# Patient Record
Sex: Male | Born: 1960
Health system: Southern US, Community
[De-identification: ages and names within clinical notes are randomized; demographics above are authoritative.]

## PROBLEM LIST (undated history)

## (undated) DIAGNOSIS — E119 Type 2 diabetes mellitus without complications: Secondary | ICD-10-CM

## (undated) DIAGNOSIS — E785 Hyperlipidemia, unspecified: Secondary | ICD-10-CM

## (undated) DIAGNOSIS — G610 Guillain-Barre syndrome: Secondary | ICD-10-CM

## (undated) HISTORY — DX: Type 2 diabetes mellitus without complications: E11.9

## (undated) HISTORY — DX: Hyperlipidemia, unspecified: E78.5

---

## 1998-01-22 ENCOUNTER — Emergency Department (HOSPITAL_COMMUNITY): Admission: EM | Admit: 1998-01-22 | Discharge: 1998-01-22 | Payer: Self-pay | Admitting: Emergency Medicine

## 1998-09-13 ENCOUNTER — Emergency Department (HOSPITAL_COMMUNITY): Admission: EM | Admit: 1998-09-13 | Discharge: 1998-09-13 | Payer: Self-pay | Admitting: Emergency Medicine

## 1998-09-13 ENCOUNTER — Encounter: Payer: Self-pay | Admitting: Emergency Medicine

## 1999-09-10 ENCOUNTER — Emergency Department (HOSPITAL_COMMUNITY): Admission: EM | Admit: 1999-09-10 | Discharge: 1999-09-10 | Payer: Self-pay | Admitting: Emergency Medicine

## 1999-09-10 ENCOUNTER — Encounter: Payer: Self-pay | Admitting: Emergency Medicine

## 2002-01-13 DIAGNOSIS — G610 Guillain-Barre syndrome: Secondary | ICD-10-CM | POA: Insufficient documentation

## 2003-01-30 ENCOUNTER — Emergency Department (HOSPITAL_COMMUNITY): Admission: EM | Admit: 2003-01-30 | Discharge: 2003-01-30 | Payer: Self-pay | Admitting: *Deleted

## 2003-02-03 ENCOUNTER — Emergency Department (HOSPITAL_COMMUNITY): Admission: EM | Admit: 2003-02-03 | Discharge: 2003-02-03 | Payer: Self-pay | Admitting: Emergency Medicine

## 2003-02-04 ENCOUNTER — Encounter: Payer: Self-pay | Admitting: Emergency Medicine

## 2003-02-04 ENCOUNTER — Inpatient Hospital Stay (HOSPITAL_COMMUNITY): Admission: EM | Admit: 2003-02-04 | Discharge: 2003-02-11 | Payer: Self-pay | Admitting: Neurology

## 2003-02-11 ENCOUNTER — Inpatient Hospital Stay (HOSPITAL_COMMUNITY)
Admission: RE | Admit: 2003-02-11 | Discharge: 2003-02-28 | Payer: Self-pay | Admitting: Physical Medicine & Rehabilitation

## 2003-03-03 ENCOUNTER — Encounter
Admission: RE | Admit: 2003-03-03 | Discharge: 2003-06-01 | Payer: Self-pay | Admitting: Physical Medicine & Rehabilitation

## 2003-03-12 ENCOUNTER — Emergency Department (HOSPITAL_COMMUNITY): Admission: EM | Admit: 2003-03-12 | Discharge: 2003-03-12 | Payer: Self-pay | Admitting: Emergency Medicine

## 2003-04-11 ENCOUNTER — Encounter
Admission: RE | Admit: 2003-04-11 | Discharge: 2003-07-10 | Payer: Self-pay | Admitting: Physical Medicine & Rehabilitation

## 2003-06-02 ENCOUNTER — Encounter
Admission: RE | Admit: 2003-06-02 | Discharge: 2003-08-31 | Payer: Self-pay | Admitting: Physical Medicine & Rehabilitation

## 2003-09-01 ENCOUNTER — Encounter
Admission: RE | Admit: 2003-09-01 | Discharge: 2003-11-30 | Payer: Self-pay | Admitting: Physical Medicine & Rehabilitation

## 2004-01-16 ENCOUNTER — Observation Stay (HOSPITAL_COMMUNITY): Admission: EM | Admit: 2004-01-16 | Discharge: 2004-01-17 | Payer: Self-pay | Admitting: *Deleted

## 2004-01-18 ENCOUNTER — Encounter
Admission: RE | Admit: 2004-01-18 | Discharge: 2004-04-17 | Payer: Self-pay | Admitting: Physical Medicine & Rehabilitation

## 2004-04-18 ENCOUNTER — Encounter
Admission: RE | Admit: 2004-04-18 | Discharge: 2004-07-17 | Payer: Self-pay | Admitting: Physical Medicine & Rehabilitation

## 2004-07-18 ENCOUNTER — Encounter
Admission: RE | Admit: 2004-07-18 | Discharge: 2004-10-16 | Payer: Self-pay | Admitting: Physical Medicine & Rehabilitation

## 2004-11-05 ENCOUNTER — Encounter
Admission: RE | Admit: 2004-11-05 | Discharge: 2005-02-03 | Payer: Self-pay | Admitting: Physical Medicine & Rehabilitation

## 2007-12-29 ENCOUNTER — Encounter
Admission: RE | Admit: 2007-12-29 | Discharge: 2008-03-28 | Payer: Self-pay | Admitting: Physical Medicine & Rehabilitation

## 2007-12-31 ENCOUNTER — Ambulatory Visit: Payer: Self-pay | Admitting: Physical Medicine & Rehabilitation

## 2008-02-07 ENCOUNTER — Ambulatory Visit: Payer: Self-pay | Admitting: Physical Medicine & Rehabilitation

## 2008-03-15 ENCOUNTER — Encounter
Admission: RE | Admit: 2008-03-15 | Discharge: 2008-03-27 | Payer: Self-pay | Admitting: Physical Medicine and Rehabilitation

## 2008-03-27 ENCOUNTER — Ambulatory Visit: Payer: Self-pay | Admitting: Physical Medicine and Rehabilitation

## 2010-05-28 NOTE — Assessment & Plan Note (Signed)
Anthony Lane returns to the clinic today since not having been seen since  June 2005.  He has a history of Guillain-Barre syndrome dating to  February 04, 2003.  He has received approximately 3 total courses of IV  immunoglobulin during his hospitalization and than post-hospitalization.  He did not have to undergo any plasmapheresis.  He had significant  weakness of his bilateral upper extremities and still has weakness and  contractures of his hands bilaterally.  He has minimal functional use of  his hands at the present time.   His main reason for return at this time is cramping of his calves along  with cramping of his forearms and hands.  He reports that there is no  significant pain at rest except on an occasional basis.  He also  complaints of his legs cramping and waking him at night.  He does  complain of twitching of his forearms and his hands bilaterally.   The patient has not followed at this point by Dr. Pearlean Brownie.  He was  released a few years ago.   The patient has not applied for disability, but is considering at the  present time.   MEDICATIONS:  None.   REVIEW OF SYSTEMS:  Noncontributory.   PHYSICAL EXAMINATION:  GENERAL:  Reasonably well-appearing, middle-aged  adult male, who ambulates without any assistive device.  EXTREMITIES:  He has 4+/5 strength throughout the bilateral upper  extremities in shoulders, biceps, triceps, and wrist extension.  Grip  strength was 4-/5 bilaterally.  He has very poor coordinate movements of  his fingers and hands bilaterally.  He complaints of tingling of his  bilateral forearms and hands with light touch.  There is mild swelling  of his fingers bilaterally.   IMPRESSION:  Persistent distal deficits of his hands related to Guillain-  Barre syndrome.   At the present time, we have had a long discussion regarding his present  symptoms.  They have question whether cutting the tendons would be  appropriate so that he could extend his  fingers.  The problem is really  not musculoskeletal, but is more in the neurological recovery.  He has  not gained the motor strength related to lack of nerve supply from his  Guillain-Barre.  Cutting the tendons would only make his hands  completely non-useful for him.  Unfortunately, I doubt that there is  anything neurological that we can do in terms of improving his  myelination on his nerves.  He has been out from his injury for  approximately 4 plus years and had maximum response to immunoglobulin  done x3 in the past, once on an outpatient basis.   At this point, we decided to try tizanidine 4 mg to be used 1 tablet  daily.  We still may need to add pain medicines or possibly muscle  relaxing medication in the future, but we will see how he responds to  the tizanidine.  We will see him in follow up at approximately 4-6 weeks  time.           ______________________________  Anthony Lane, M.D.     DC/MedQ  D:  12/31/2007 14:48:54  T:  01/01/2008 06:59:38  Job #:  [id number]

## 2010-05-28 NOTE — Assessment & Plan Note (Signed)
Anthony Lane is a 50 year old married gentleman who has been previously  followed by Dr[facility] Ellwood [facility].   He was last seen by Dr. Thomasena Edis on February 07, 2008.   Anthony Lane has a history of Guillain-Barre  syndrome which was diagnosed  back in 2005 which occurred after GI illness.  He was hospitalized at  that time and was followed by Dr. Pearlean Brownie.   He was left subsequently with weakness and spasticity in both hands  right worse than left as well as some mild weakness and cramping in his  bilateral lower extremity.  Pain is not a major problem for him.  His  average pain is about 1 on the scale of 10.   He had undergone some physical therapy early on in his illness.  However, he has not had any therapy for several years now.   His chief complaint is spasticity and decreased strength and fine motor  coordination of the right upper extremity.  He has similar problems in  the left upper extremity, but not as severe.   He is able to walk at least 15-20 minutes without difficulty.  If he  tries to run or walks much longer, he does get some cramping in his  calf.   He has been using tizanidine and has found that to be somewhat  beneficial in helping manage some of his cramping and spasms over the  last few months.  He has been on it about 2 months now.   He does not have a primary care physician at this time.   Functional status is as follows, he is able to walk 15-20 minutes.  He  can climb stairs.  He does drive.  He is independent with self-care and  sometime needs assistance with fasteners which require fine motor  attention.   He is independent with feeding, dressing, bathing, toileting, meal prep,  household duties.  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  He does not have a lot of endurance for  these activities and breaks up his task quite a bit.  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  He has 1 year of college in the  past.   Denies problems controlling bowel or bladder.  Denies depression anxiety  or suicidal ideation.  Does admit to some tremors and spasms especially  in the hands and distal lower extremities.   Review of systems otherwise negative.   Past medical history is noncontributory.  He has had no previous  surgeries.   SOCIAL HISTORY:  The patient is married and lives with 65, 35, and 51-  year-old.  He has an college age son as well who is not in the house.  Denies illegal substance abuse.  Denies smoking and alcohol.   FAMILY HISTORY:  Positive mother was killed in a car accident when she  was 61 year old, father alive age 10, history of COPD, smoker.  He has 2  brothers, one died the details are not clear, and he has a 7 year old  brother who is healthy.   MEDICATIONS:  He is currently on include just tizanidine 4 mg one p.o.  b.i.d.   No known drug allergies.  He does have some food allergies such as  popcorn and dry blueberries.   PHYSICAL EXAMINATION:  Blood pressure is 118/87, pulse 90, respirations  18, 97% saturated on  room air.  He is well-developed, well-nourished  gentleman who does not appear in any distress.  He is oriented x3.  Speech is clear.  Affect is bright.  He is alert, cooperative, and  pleasant.  Follows commands without difficulty.  Answers questions  appropriately.   Cranial nerves are grossly intact.  Coordination is diminished in both  hands.  Reflexes are overall 2+ at the upper extremities symmetric, 2+  at the patellar tendons, and 1+ at the Achilles tendons bilaterally.   Increased tone is noted in the right upper extremity especially in the  wrist flexors and finger flexors, and also notable in the left upper  extremity, however, not as severe as in the right upper extremity.   Weakness is noted in bilateral finger flexors and intrinsics, finger  extensors, wrist extensors, and wrist flexors,  5/5 at biceps, triceps,  brachioradialis, and shoulder abductors.  Internal and external rotation  are all within normal strength range.  He has 5/5 strength at knee  flexors, extensors, hip extensors, and flexors are 5/5.  Dorsiflexors  and plantar flexors are 5/5.  Weakness and EHL as noted bilaterally,  however, evertors are 5/5.   Gait is nonantalgic.  He has good balance.  Tandem gait.  Romberg test  are performed adequately.  He has tightness in bilateral pectoralis  muscles, right worse than left.  Internal and external rotation are  tight as well.   Diminished sensation to pinprick, temperature in upper and lower  extremities, intact vibratory sense is appreciated, however.   IMPRESSION:  History of Guillain-Barre  syndrome with residual deficits  in predominately upper extremities with strength and spasticity, lower  extremity bilaterally mild with deficits noted with respect to endurance  and spasms.   Pain is not a major problem for him at this time.   PLAN:  We will continue tizanidine.  He has couple of refills left from  Dr. Thomasena Edis.  We will obtain some blood work to check creatinine as well  as liver functions.  I reviewed some exercises for him today to work on  stretching as well as some strengthening.  He may consider more of a  formal physical therapy program in the upcoming months.  We discussed  possibility of Botox as well, but we will start some therapy prior to  that.  We will see him back in 2 months.           ______________________________  Brantley Stage, M.D.     DMK/MedQ  D:  03/27/2008 11:40:13  T:  03/28/2008 01:52:17  Job #:  [id number]

## 2010-05-28 NOTE — Assessment & Plan Note (Signed)
Anthony Lane returns to clinic today for followup evaluation.  We last saw  him in this office on December 31, 2007, for evaluation of spasticity  and tingling related to Guillain-Barre syndrome.  We had started him on  tizanidine 4 mg daily at that time.  He reports that he has had some  improvement in the spasms of his left lower leg and his right forearm.  He does not experience any significant side effects related to the  medication.   MEDICATIONS:  Tizanidine 4 mg daily.   REVIEW OF SYSTEMS:  Noncontributory.   PHYSICAL EXAMINATION:  Well-appearing, middle-aged adult male, ambulates  without any assist device.  Blood pressure is 123/80 with pulse of 76,  respiratory rate 18, and O2 saturation 97% on room air.  Grip was 4-/5  bilaterally with poor coordinated movements of his fingers and hands  bilaterally.  There is mild swelling of the fingers bilaterally.   IMPRESSION:  Persistent distal deficits of the hands related to Guillain-  Barre syndrome.   In the office today, we did suggest that he increase his tizanidine up  to 4 mg b.i.d., and we will monitor him for side effects or benefit.  We  will plan on seeing the patient in followup in this office in  approximately 2 months' time.           ______________________________  Ellwood Dense, M.D.     DC/MedQ  D:  02/09/2008 09:46:32  T:  02/09/2008 23:46:16  Job #:  [id number]

## 2010-05-31 NOTE — Discharge Summary (Signed)
NAME:  Anthony Lane, Anthony Lane                        ACCOUNT NO.:  [id number]   MEDICAL RECORD NO.:  [id number]                   PATIENT TYPE:  IPS   LOCATION:  4005                                 FACILITY:  [facility]   PHYSICIAN:  Ellwood Dense, M.D.                DATE OF BIRTH:  1960-07-26   DATE OF ADMISSION:  02/11/2003  DATE OF DISCHARGE:  02/28/2003                                 DISCHARGE SUMMARY   DISCHARGE DIAGNOSES:  1. Guillain-Barre syndrome.  2. Abnormal liver function tests.  3. Neuropathy, improved.   HISTORY OF PRESENT ILLNESS:  Anthony Lane is a 50 year old male in relative  good health admitted to [facility] January 22 with recent GI viral  illness with weight loss and four-day history of progressive bilateral lower  extremity weakness and trouble ambulating and handling things.  Dr. Pearlean Brownie  was consulted for workup.  MRI of brain done showed no acute abnormality and  nonspecific hyperintensity.  MRI of spine showed small paracentral HNP at 4-  5.  No cord compression.  Spinal tap showed elevated protein.  Other workup  included elevated ESR with ANA, RPR nonreactive, HIV nonreactive.  TSH,  vitamin, Monospot test were all negative.  Neurology feels patient's  ascending paralysis related to GBS even though reflexes were preserved.  The  patient was treated with IVIG x 5 doses.  PT and OT initiated and patient  minimum assistance 75% transfers, ambulating 250 feet with close  supervision, increasing fatigue, total assistance for ADLs.   PAST MEDICAL HISTORY:  Negative for major illnesses or surgery.   ALLERGIES:  No known drug allergies.   SOCIAL HISTORY:  The patient is married, works as a Medical illustrator, and was  independent prior to admission.  Lives in two-level home with three to four  steps at entry, can stay on first level.  He does not use any tobacco or  alcohol.   HOSPITAL COURSE:  Anthony Lane was admitted to rehab on February 11, 2003, for  inpatient therapy to consist of PT, OT daily.  Past admission, he  was maintained on subcutaneous Lovenox for DVT prophylaxis.  He was noted to  have __________ right lower extremity and ____________.  Labs done past  admission showed hemoglobin 13.9, hematocrit 29.3, white count 4.5,  platelets 322.  Sodium 137, potassium 4.3, chloride 104, CO2 25, BUN 27,  creatinine 1.2, glucose 98.  LFTs continued to be slightly elevated with AST  53, ALT 78, total bilirubin 1.3.  The patient has had no GI symptoms.   At time of admission, the patient's strength passive range of motion right  upper extremity was  within normal limits.  Shoulder strength on right was  4/5.  Shoulder abductors at 3/5.  Elbow flexion extension 2/5.  Supination  3/5, pronation 3/5, wrist flexors 1/5, wrist extensors 2/5, intrinsics for  flexion 1 to 3 range and 0/5 for  extension.  Passive range of motion left  upper extremity  within functional limits.  Strength of shoulder and elbow  flexors and extensors 3/5.  He was at 2/5 for supination, 3/5 for pronation.  Wrist was grossly estimated at 1/5 flexors, 0/5 extension, digits 1 to 4  range for flexors.  In terms of lower extremities, he was noted to have  plantar dorsiflexion knee flexion extension at 2 to 4/5 with hip flexors  4+/5.  Therapies initiated.  Lab equipment was ordered to help patient with  self feed.  Bilaterally boots were also ordered to help with gait.  The  patient did progress to being modified independent for transfers, modified  independent for ambulating 350 feet with rolling walker.  He required  supervision for stairs.  In terms of ADLs, he is at supervision for bathing  with assistive device, modified independent for upper body dressing, minimum  assistance for lower body dressing.  He is modified independent for grooming  and feeding.  He is at supervision for tub-shower transfers, modified  independent for toilet transfer, minimum assistance for  hygiene, minimum  assistance for manipulation.   At time of discharge, the patient's upper extremity strength remains the  same.  Range of motion of shoulder on right and left is improved to 160 plus  shoulder flexion abductors at 170 degrees.  Both flexion and extension  remains the same.  Supination, pronation bilaterally is approximately 20  degrees.  Wrist flexors with gravity eliminated is approximately 10 degrees  bilaterally.  He is noted to have minimal finger movement.  Dorsal wrist  supports with cuff have been used for self feeding and writing.  The patient  will continue to receive followup PT and OT at [facility] beginning March 03, 2003.   On February 28, 2003, the patient is discharged to home.   DISCHARGE MEDICATIONS:  1. Ibuprofen 800mg  t.i.d.  2. Colace 100 mg 2 p.o. q.a.m.  3. Ambien 5 mg q.h.s. p.r.n.  4. Coated aspirin 325 mg daily x 1 month.   ACTIVITY:  Use walker.   DIET:  Regular.   WOUND CARE:  Not applicable.   SPECIAL INSTRUCTIONS:  May use Tylenol additionally for pain.  May use  Dulcolax tablets p.r.n. constipation.  No alcohol, no smoking, no driving.  Outpatient PT, OT at [facility] beginning March 03, 2003, at 11:15 a.m. to 1:30 p.m.   FOLLOW UP:  The patient is to follow up with Dr. Pearlean Brownie for appointment in  three to four weeks.  Follow up with Dr. Thomasena Edis on March 30 at 12:30 p.m.      Greg Cutter, P.A.                    Ellwood Dense, M.D.    PP/MEDQ  D:  03/16/2003  T:  03/17/2003  Job:  [id number]   cc:   Pramod P. Pearlean Brownie, MD  Fax: [phone]

## 2010-05-31 NOTE — Assessment & Plan Note (Signed)
FOLLOW UP:  Anthony Lane returns to the clinic today for follow-up  evaluation.  He is a 50 year old adult male who is admitted to [facility] February 04, 2003 with a recent viral illness and a weight  loss along with a four-day history of progressive bilateral lower extremity  weakness.  He was admitted and a MRI scan showed no acute abnormality with  nonspecific hyperintensity.  MRI scan showed small paracentral herniated  nucleus pulposus at L4/5 without cord compression.  Spinal tap showed  elevated protein.  Workup revealed elevated ESR and ANA.  The patient was  felt to be suffering from Guinn-Barre syndrome and was started on IVIG x 5  doses over five days.   The patient eventually stabilized and was moved to the rehabilitation unit  on February 11, 2003.  He remained there through discharge February 28, 2003.   Since discharge, the patient has been attending outpatient physical therapy  and occupational therapy twice per week.  He has followed up with his  doctor, Dr. Janalyn Shy P. Pearlean Brownie, this past Monday, April 10, 2003.  Dr. Janalyn Shy  P. Pearlean Brownie is planning restart of immunoglobulin over five days either at home  or on an outpatient basis at the hospital.  They are in the process of  trying to set that up and the patient will be informed of their plans once  that has been set up through their insurance carrier.  They also discussed  possible plasmapheresis but decided to go with immunoglobulin at the present  time.   The patient is not driving on his own at the present time.  He does dress  himself 90% of the time and feeds himself after meals are prepared.  He does  have trouble especially with buttons, buckles, along with zippers, and  dawning his dogs.  He is otherwise independent with all mobility.  He has  taken the ankle port orthosis off his left lower extremity and feels that  has actually helped.  He has slight improved strength on the right lower  extremity  compared to the left.  He still has severe weakness of the distal  bilateral upper extremities.  He has noticed left hip pain once the ankle  port orthosis was stopped.  He is ambulating without a device at the present  time.   MEDICATIONS:  1. Ibuprofen 800 mg t.i.d. p.r.n.  2. Colace p.r.n.  3. Ambien p.r.n.  4. Coated aspirin q.d.   PHYSICAL EXAMINATION:  GENERAL:  A well-appearing adult male.  VITAL SIGNS:  Blood pressure 118/82 with a pulse of 98.  O2 saturation of  98% on room air.  EXTREMITIES:  Proximal bilateral upper extremity showed biceps of 4/5, an  elbow extension of 3+/5.  Grip was 2+ to 3-/5 bilaterally with minimal  individual finger movements.  Bilateral lower extremity exam showed hip  flexion at 5-/5 and the extension at 4+/5.  Ankle dorsiflexion was 3-/5  bilaterally.  Sensation was decreased to light touch below elbows and below  knees bilaterally.   IMPRESSION:  Persistent distal deficits related to Guinn-Barre syndrome.   RECOMMENDATIONS:  At the present time, Dr. Janalyn Shy P. Pearlean Brownie is in the  processing of arranging either home health immunoglobulin or outpatient IVIG  over five days.  At this point, the patient will continue on outpatient  therapies as his schedule allows depending on the immunoglobulin  administration.  We will plan on seeing him in follow-up in this clinic in  approximately two months time.  He continues to make slow progress and  hopefully another course of immunoglobulin will him make substantially more  improvement.   The patient continues to be unemployable at the present time and reports  that he is allowed long-term disability for approximately six months by his  employer.  It has been a little over two months at this point.   I will plan on seeing the patient in follow-up in approximately two months  time.      Ellwood Dense, M.D.   DC/MedQ  D:  04/12/2003 16:01:01  T:  04/12/2003 17:11:02  Job #:  [id number]

## 2010-05-31 NOTE — Assessment & Plan Note (Signed)
FOLLOW UP:  Mr. Abreu returns to the clinic today for follow-up evaluation.  He has experienced onset of Guillain-Barre syndrome starting approximately  February 04, 2003.  The patient received a five-day course of immunoglobulin  while hospitalized.  He subsequently was removed to the rehabilitation unit  on February 11, 2003.  He remained there through discharge February 28, 2003.   Since discharge, the patient has followed up in this office.  He has also  undergone a second course of immunoglobulin approximately eight weeks ago.  He is due for nerve conduction studies along with EMG this coming Friday.  Dr. Janalyn Shy P. Pearlean Brownie is still considering plasmapheresis based on those  studies.   The patient is not driving and does need some help with socks at the present  time.  Otherwise, he is independent with most normal activities of daily  living.  He has not returned to work and is in the process of trying to  reach his employer to see if possibly they could have him back at modified  duty.  He is very fearful about losing his job as that also supplies him,  four children, and wife with health insurance.  The patient still  understands that he may have to apply for SSI to maintain health insurance  for himself and his family.   MEDICATIONS:  None.   REVIEW OF SYMPTOMS:  The patient reports minimal sporadic pain of his  bilateral hands, forearms, along with left foot.  He reports that these are  not severe enough to require any pain medicines at the present time.   PHYSICAL EXAMINATION:  GENERAL:  Well-appearing adult male with obvious  weakness of his distal upper extremities.  VITAL SIGNS:  Blood pressure was 141/103 with pulse of 84, and O2 saturation  of 96% on room air.  NEUROLOGICAL:  Bilateral upper extremity exam showed 5/5 strength of the  shoulder and elbow extension was 4+/5 on the right and 4/5 on the left.  Elbow flexion was 3+/5 bilaterally.  Grip was 0 to 1/5 bilaterally  and wrist  extension 0 to 1/5.  He has obvious swollen fingers bilaterally with  decreased range of motion especially of the fingers and wrist bilaterally.  Bilateral lower extremity exam showed hip flexion, knee extension, and ankle  dorsiflexion at 5-/5 bilaterally.   IMPRESSION:  Persistent distal deficits related to Guillain-Barre syndrome.   RECOMMENDATIONS:  At the present time, Dr. Janalyn Shy P. Pearlean Brownie is considering  plasmapheresis for Ms. Riding.  We will plan on seeing the patient in follow-  up in his office in approximately two months time.  He can continue as  outpatient physical therapy and occupational therapy and is making good  progress overall, although he still has very severe dysfunction of his  bilateral upper extremities, especially distally.  I will plan on seeing the  patient in follow-up as noted above.      Ellwood Dense, M.D.   DC/MedQ  D:  06/14/2003 15:29:24  T:  06/14/2003 19:38:53  Job #:  [id number]

## 2010-05-31 NOTE — H&P (Signed)
NAME:  Anthony Lane, Anthony Lane                        ACCOUNT NO.:  [id number]   MEDICAL RECORD NO.:  [id number]                   PATIENT TYPE:  INP   LOCATION:  ED                                   FACILITY:  [facility]   PHYSICIAN:  Pramod P. Pearlean Brownie, MD                 DATE OF BIRTH:  Nov 11, 1960   DATE OF ADMISSION:  02/04/2003  DATE OF DISCHARGE:                                HISTORY & PHYSICAL   REFERRING PHYSICIAN:  Dr. Devoria Albe.   REASON FOR REFERRAL:  Hand and leg weakness.   HISTORY OF PRESENT ILLNESS:  The patient is a 50 year old, elderly,  Caucasian male, who has been complaining of progressive bilateral hand and  leg weakness for the last 4 days.  The patient states that he had apparent  GI viral illness last week.  He had diarrhea, nausea, and vomiting last  Friday through the weekend.  He was seen in the emergency room and was  treated for dehydration and sent home.  The patient states he continued to  have a poor appetite and has lost 15 pounds weight.  Since Wednesday, he  complained of difficulty walking and weakness in his hands.  He was seen in  the emergency room yesterday at [facility] and apparently told that  he had some weakness as a result of his viral illness.  Neurology was not  consulted.  The patient returns today saying that his weakness has  progressed.  He has trouble walking and seems to be off balance, even while  walking on level ground.  He has also had difficulty holding objects in his  hands.  He denies any headache, nausea, vomiting, slurred speech, neck pain,  radicular symptoms, tingling, numbness, burning, or pain in his extremities.  There is no urinary hesitancy or retention, any abnormal sweating or  dizziness.  He has no significant past neurological history.   PAST MEDICAL HISTORY:  Unremarkable.   PAST SURGICAL HISTORY:  None.   MEDICATION ALLERGIES:  None.   CURRENT HOME MEDICATIONS:  None.   REVIEW OF SYSTEMS:  Significant for  nausea, vomiting, and diarrhea for 3  days last week, as stated earlier.  No chest pain, shortness of breath,  cough, cold.   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  He does not smoke or drink or do drugs.  He is  married and lives with his wife in [place].   PHYSICAL EXAMINATION:  GENERAL:  A healthy-looking, young male, who is not  in distress.  VITAL SIGNS:  He is afebrile with pulse rate of 72 per minute and regular,  respiratory rate 16 per minute __________.  HEAD:  Nontraumatic.  NECK:  Supple without bruit.  ENT:  Unremarkable.  CARDIAC:  No murmur or gallop.  LUNGS:  Clear to auscultation.  The patient has no shortness of  breath.  He  has a good breath-holding time of greater than 60 seconds and a single  breath count greater than 65.  NEUROLOGIC:  Awake, alert, and oriented x 3 with normal speech and language  function.  There is no aphasia, apraxia, or dysarthria.  Pupils are equal,  reactive to light.  He has horizontal gaze, evoke nystagmus while looking in  either direction, right more than left, and there is no weakness of the  facial muscles eye closure, polyp.  He has good strength in his neck  extensors as well as flexors.  UPPER EXTREMITY:  No drift.  He has 5/5 strength at the shoulders  bilaterally.  He has 4 minus strength at both elbows, slightly _________  left greater than right.  He has grade 3 strength at both wrists and has a  poor grip and significant weakness of long finger flexors and __________  hand muscles of both hands.  He has 5/5 strength in both hips and knees  bilaterally.  He has bilateral foot drop with 0-5 strength, and that  includes dorsiflexors and everters, and he has 4/5 strength in both plantar  flexors.  His jaw jerk, biceps, triceps, and supinator jerks are easily  inducible.  Knee jerks are brisk.  Ankle jerks are depressed but can be  elicited.  Plantars are both  downgoing.  He has intact touch, pinprick  position and vibration sensation.  Finger-to-nose coordination is slightly  impaired bilaterally.  Knee to heel coordination is slow but accurate.  The  patient's gait was not tested.   DATA REVIEWED:   LABS:  Blood chemistries and CBC are unremarkable.  Head CAT scan,  noncontrast, shows no significant abnormalities.   IMPRESSION:  A 50 year old gentleman with 4 day history of progressive  distal lower and upper extremity weakness with preserved reflexes following  viral gastrointestinal illness.  The time frame and history is suggestive of  postinfectious polyneuropathy like Guillain-Barre syndrome; however, the  preserved reflexes would be unusual though still possible given the fact  that his symptoms are of only 4 days of duration.  Other possibility  includes postviral acute disseminated encephalomyelitis (ADEM).   PLAN:  The patient is being admitted to the neurology service for further  evaluation and treatment.  We will obtain MRI scan of the brain with and  without contrast as well as to evaluate for any demyelinating brain lesions  and MRI scan of the C-spine to rule out any evidence of cord compression.  If both these are negative, may consider doing a spinal tap to look for  elevated CSF, proteins.  The patient may benefit with treatment with IVIG  which will consider after the above test results come back.  The patient  will need close neurological and respiratory monitoring to look for signs of  progression.  At the present time, his respiratory status seems quite stable  and hence will admit him to a regular floor.  If he starts showing signs of  difficulty breathing, we will need to transfer him to the intensive care  unit for further close monitoring.  I had a long discussion with the  patient, his wife, and multiple family members about his symptoms and discussed by clinical impression, differential diagnosis, and plan for   evaluation and treatment and answered questions.  Pramod P. Pearlean Brownie, MD    PPS/MEDQ  D:  02/04/2003  T:  02/04/2003  Job:  [id number]

## 2010-05-31 NOTE — Discharge Summary (Signed)
NAME:  Anthony Lane, Anthony Lane                        ACCOUNT NO.:  [id number]   MEDICAL RECORD NO.:  [id number]                   PATIENT TYPE:  INP   LOCATION:  3003                                 FACILITY:  [facility]   PHYSICIAN:  Pramod P. Pearlean Brownie, MD                 DATE OF BIRTH:  [birth year]   DATE OF ADMISSION:  02/04/2003  DATE OF DISCHARGE:  02/11/2003                                 DISCHARGE SUMMARY   ADMISSION DIAGNOSES:  Bilateral hand and leg weakness.   DISCHARGE DIAGNOSES:  Acute posterior infectious polyneuropathy, likely  Guillain-Barre's syndrome.   HOSPITAL COURSE:  The patient is a pleasant 51 year old Caucasian male who  developed bilateral distal hand and foot weakness four days to admission.  The patient had a preceding apparent GI viral illness one week ago, with  diarrhea, nausea and vomiting.  He denied any paresthesias or neck pain.  When evaluated in the [facility] Emergency Room he had significant  distal weakness, with foot drop and severe hand and wrist weakness.  Deep  tendon reflexes were preserved, except ankle jerks were depressed.  The  patient was admitted for evaluation and treatment for possible posterior  infectious polyneuropathy.  He was transferred to [facility], where he was hospitalized to 3000.  He was kept on telemetry  monitoring, which did not reveal significant cardiac arrhythmias during the  course of hospitalization.  He did not have significant weakness of the  facial or neck muscles.  He had good breath-holding time and forced vital  capacity remained between 2-4 L; NIF remained greater 80-120 during the  hospitalization.   He progressed on the day after hospitalization and there was some increasing  weakness of both shoulders, biceps and triceps.  There was minimal weakness  of the hip flexors.  His deep tendon reflexes are relatively preserved,  except biceps and supine intratrochanteric's, which would  transiently  depressed for a few days.   He had MRI scan of the brain performed on February 04, 2003; did not reveal  any evidence of demyelinating lesions in the brain.  MRI of the C-spine also  revealed no evidence of compression or demagnifications.  A spinal tap was  performed which showed minimally elevated protein of 53 mg%, without  significantly increased status.   The patient was given a test dose of IV IG 5 mg on February 05, 2003.  He  tolerated it without any reaction.  Subsequently he was given IV Ig 0.4  g/km/day for five consecutive days.  He tolerated this with only minimal  discomfort and pain, which responded to medication with Tylenol and Vicodin.   On the day of discharge he had finished his five days of IV IG, and actually  had started noticing some improvement.  He complained of some paresthesia  infarcts, and was in fact able to bend his toes and fingers a little  bit;  his shoulder also seemed stronger though still weak.  He had significant  weakness of both hand and grip, as well as wrist muscles and bilateral foot  drop with 0/5 strength in ankle dorsiflexors and 2/5 strength in the plantar  flexors.  Deep tendon reflexes could easily be detected.  The biceps jerk  was a little depressed.   His forced vital capacity was 4.5 L and NIF was greater than -120.  He was  discharged to Rehabilitation and was advised to follow-up with Dr. Pearlean Brownie in  his office in two months.   DISCHARGE MEDICATIONS:  1. Lovenox injection b.i.d.  2. Tylenol Extra-Strength p.r.n. pain.  3. Colace 100 mg q.d.  4. Ibuprofen 800 mg t.i.d. p.r.n. pain.  5. Vicodin 1-2 tablets q.4h. p.r.n. pain.   LABORATORY DATA:  ANA was negative.  VSR was normal.  __________ were  normal.  Hepatitis panel negative.  HIV negative.  CMV antibodies:  IgG  elevated, though IgM was normal.  Urine positive for porphobilinogen was  negative.                                                Pramod P. Pearlean Brownie,  MD    PPS/MEDQ  D:  02/11/2003  T:  02/11/2003  Job:  [id number]

## 2012-04-25 ENCOUNTER — Encounter (HOSPITAL_COMMUNITY): Payer: Self-pay | Admitting: *Deleted

## 2012-04-25 ENCOUNTER — Emergency Department (HOSPITAL_COMMUNITY): Payer: Medicare Other

## 2012-04-25 ENCOUNTER — Emergency Department (HOSPITAL_COMMUNITY)
Admission: EM | Admit: 2012-04-25 | Discharge: 2012-04-25 | Disposition: A | Discharge status: Home / Self Care | Payer: Medicare Other | Attending: Emergency Medicine | Admitting: Emergency Medicine

## 2012-04-25 DIAGNOSIS — R209 Unspecified disturbances of skin sensation: Secondary | ICD-10-CM | POA: Insufficient documentation

## 2012-04-25 DIAGNOSIS — Z8669 Personal history of other diseases of the nervous system and sense organs: Secondary | ICD-10-CM | POA: Insufficient documentation

## 2012-04-25 DIAGNOSIS — M25519 Pain in unspecified shoulder: Secondary | ICD-10-CM | POA: Insufficient documentation

## 2012-04-25 DIAGNOSIS — M25512 Pain in left shoulder: Secondary | ICD-10-CM

## 2012-04-25 DIAGNOSIS — Z7982 Long term (current) use of aspirin: Secondary | ICD-10-CM | POA: Insufficient documentation

## 2012-04-25 HISTORY — DX: Guillain-Barre syndrome: G61.0

## 2012-04-25 LAB — COMPREHENSIVE METABOLIC PANEL
ALT: 66 U/L — ABNORMAL HIGH (ref 0–53)
AST: 36 U/L (ref 0–37)
Albumin: 4.2 g/dL (ref 3.5–5.2)
Alkaline Phosphatase: 58 U/L (ref 39–117)
BUN: 14 mg/dL (ref 6–23)
CO2: 28 mEq/L (ref 19–32)
Calcium: 10 mg/dL (ref 8.4–10.5)
Chloride: 100 mEq/L (ref 96–112)
Creatinine, Ser: 1.1 mg/dL (ref 0.50–1.35)
GFR calc Af Amer: 87 mL/min — ABNORMAL LOW (ref >90)
GFR calc non Af Amer: 75 mL/min — ABNORMAL LOW (ref >90)
Glucose, Bld: 136 mg/dL — ABNORMAL HIGH (ref 70–99)
Potassium: 3.8 mEq/L (ref 3.5–5.1)
Sodium: 137 mEq/L (ref 135–145)
Total Bilirubin: 0.5 mg/dL (ref 0.3–1.2)
Total Protein: 7.6 g/dL (ref 6.0–8.3)

## 2012-04-25 LAB — CBC
HCT: 42.5 % (ref 39.0–52.0)
Hemoglobin: 15.7 g/dL (ref 13.0–17.0)
MCH: 32.8 pg (ref 26.0–34.0)
MCHC: 36.9 g/dL — ABNORMAL HIGH (ref 30.0–36.0)
MCV: 88.9 fL (ref 78.0–100.0)
Platelets: 235 10*3/uL (ref 150–400)
RBC: 4.78 MIL/uL (ref 4.22–5.81)
RDW: 12.2 % (ref 11.5–15.5)
WBC: 8.6 10*3/uL (ref 4.0–10.5)

## 2012-04-25 MED ORDER — HYDROCODONE-ACETAMINOPHEN 5-325 MG PO TABS: 1.0000 | ORAL_TABLET | Freq: Four times a day (QID) | ORAL | Status: DC | PRN

## 2012-04-25 NOTE — ED Notes (Signed)
Pt also states he took 3 81 mg ASA pta

## 2012-04-25 NOTE — ED Provider Notes (Addendum)
History     CSN: [id number]  Arrival date & time 04/25/12  Anthony Lane   First MD Initiated Contact with Patient 04/25/12 2058      Chief Complaint  Patient presents with  . Arm Pain    (Consider location/radiation/quality/duration/timing/severity/associated sxs/prior treatment) HPI Comments: Anthony Lane is a 52 year old gentleman, with complicated history of Guillian-Barre  Reye's syndrome.  Several years ago, with residual paresthesias.  He reports, that he's had intermittent, left shoulder pain for quite a while, but today woke up with persistent pain.  That has progressed to numbness in his arm radiating to his neck, and the side of his face.  Denies any chest pain, shortness of breath, diaphoresis, or nausea.  He does, state that the pain increases with certain movements.  Patient is a 52 y.o. male presenting with arm pain. The history is provided by the patient.  Arm Pain This is a recurrent problem. The current episode started today. The problem occurs constantly. The problem has been gradually worsening. Associated symptoms include numbness. Pertinent negatives include no fever or joint swelling.    Past Medical History  Diagnosis Date  . Guillain-Barre syndrome     History reviewed. No pertinent past surgical history.  History reviewed. No pertinent family history.  History  Substance Use Topics  . Smoking status: Never Smoker   . Smokeless tobacco: Not on file  . Alcohol Use: No      Review of Systems  Constitutional: Negative for fever.  Musculoskeletal: Negative for joint swelling.  Neurological: Positive for numbness.  All other systems reviewed and are negative.    Allergies  Amoxicillin  Home Medications   Current Outpatient Rx  Name  Route  Sig  Dispense  Refill  . aspirin 81 MG chewable tablet   Oral   Chew 243 mg by mouth once.         Marland Kitchen HYDROcodone-acetaminophen (NORCO/VICODIN) 5-325 MG per tablet   Oral   Take 1 tablet by mouth every 6  (six) hours as needed for pain.   10 tablet   0     BP 145/89  Pulse 71  Temp(Src) 98.1 F (36.7 C) (Oral)  Resp 20  SpO2 95%  Physical Exam  Nursing note and vitals reviewed. Constitutional: He appears well-developed.  HENT:  Head: Normocephalic.  Eyes: Pupils are equal, round, and reactive to light.  Neck: Normal range of motion.  Cardiovascular: Normal rate and regular rhythm.   Pulmonary/Chest: Effort normal and breath sounds normal.  Musculoskeletal: He exhibits tenderness. He exhibits no edema.       Left shoulder: He exhibits decreased range of motion, tenderness and pain. He exhibits no bony tenderness, no swelling, no effusion, no crepitus, no deformity, no laceration and normal pulse.  Patient with contracture deformities of both hands as result of [place]    ED Course  Procedures (including critical care time)  Labs Reviewed  CBC - Abnormal; Notable for the following:    MCHC 36.9 (*)    All other components within normal limits  COMPREHENSIVE METABOLIC PANEL - Abnormal; Notable for the following:    Glucose, Bld 136 (*)    ALT 66 (*)    GFR calc non Af Amer 75 (*)    GFR calc Af Amer 87 (*)    All other components within normal limits  POCT I-STAT TROPONIN I   Dg Shoulder Left  04/25/2012  *RADIOLOGY REPORT*  Clinical Data: Left shoulder pain.  LEFT SHOULDER - 2+ VIEW  Comparison: None.  Findings: There is no fracture, dislocation, or soft tissue calcification.  Old healed fracture of the left clavicle.  IMPRESSION: No acute abnormality.   Original Report Authenticated By: Francene Boyers, M.D.      1. Shoulder pain, left     ED ECG REPORT   Date: 04/25/2012  EKG Time: 10:44 PM  Rate: 83  Rhythm: sinus arrhythmia, incomplete right bundle branch block,  there are no previous tracings available for comparison  Axis: normal  Intervals:none  ST&T Change: normal  Narrative Interpretation: abnormal             MDM   Patient's troponin is  negative.  His EKG reveals a sinus rhythm, with sinus arrhythmia, and incomplete right bundle branch block.  There are no old EKGs for comparison.  Labs reviewed as a negative troponin Will x-ray shoulder, and refer patient to orthopedics X-ray of the left shoulder is noncontributory to the patient's pain.  He has been referred to Dr. supple.  For orthopedic surgery for further evaluation and physical therapy        Arman Filter, NP 04/25/12 2245  Arman Filter, NP 04/27/12 [phone]

## 2012-04-25 NOTE — ED Notes (Signed)
Pt c/o left arm numbness and tingling that began this morning when he woke up.  States it feels like "pins and needles" at this time. Denies n/v/d, SOB, CP.

## 2012-04-27 NOTE — ED Provider Notes (Signed)
Medical screening examination/treatment/procedure(s) were performed by non-physician practitioner and as supervising physician I was immediately available for consultation/collaboration.  Raeford Razor, MD 04/27/12 1115

## 2012-04-28 NOTE — ED Provider Notes (Signed)
Medical screening examination/treatment/procedure(s) were performed by non-physician practitioner and as supervising physician I was immediately available for consultation/collaboration.  Raeford Razor, MD 04/28/12 1537

## 2013-04-15 DIAGNOSIS — A938 Other specified arthropod-borne viral fevers: Secondary | ICD-10-CM | POA: Diagnosis not present

## 2014-02-14 IMAGING — CR DG SHOULDER 2+V*L*
3 series · 3 of 3 positions shown · non-contrast
Comparison: None.

CLINICAL DATA: Left shoulder pain.

LEFT SHOULDER - 2+ VIEW

[w shoulder ap internal left]
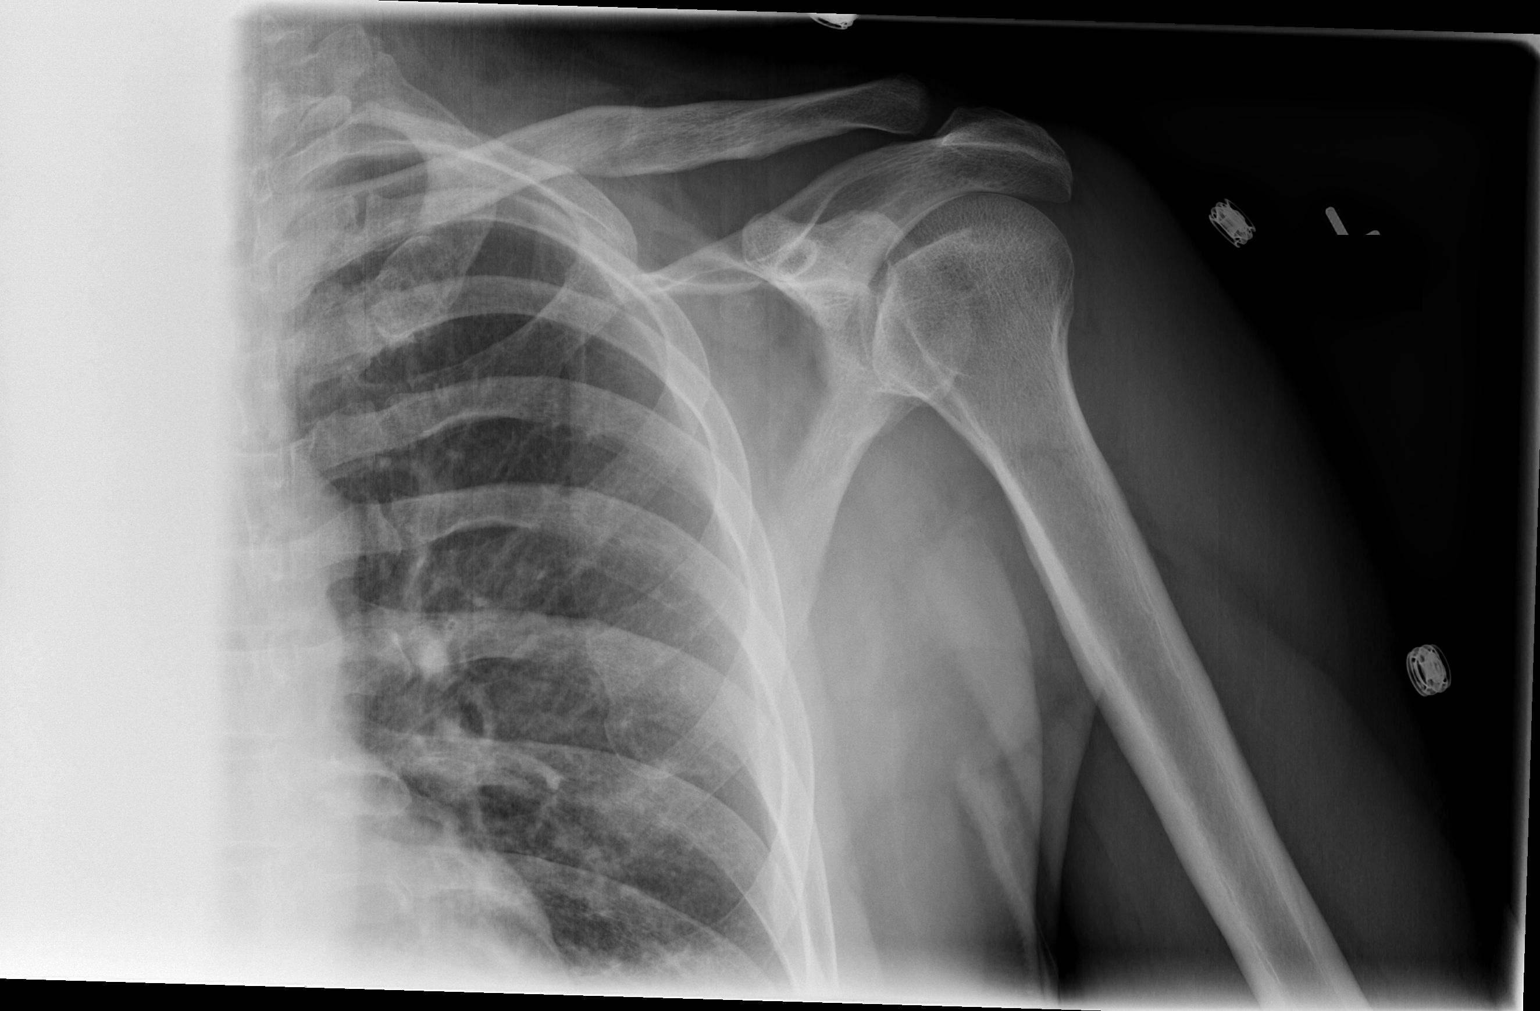

[w shoulder ap external left]
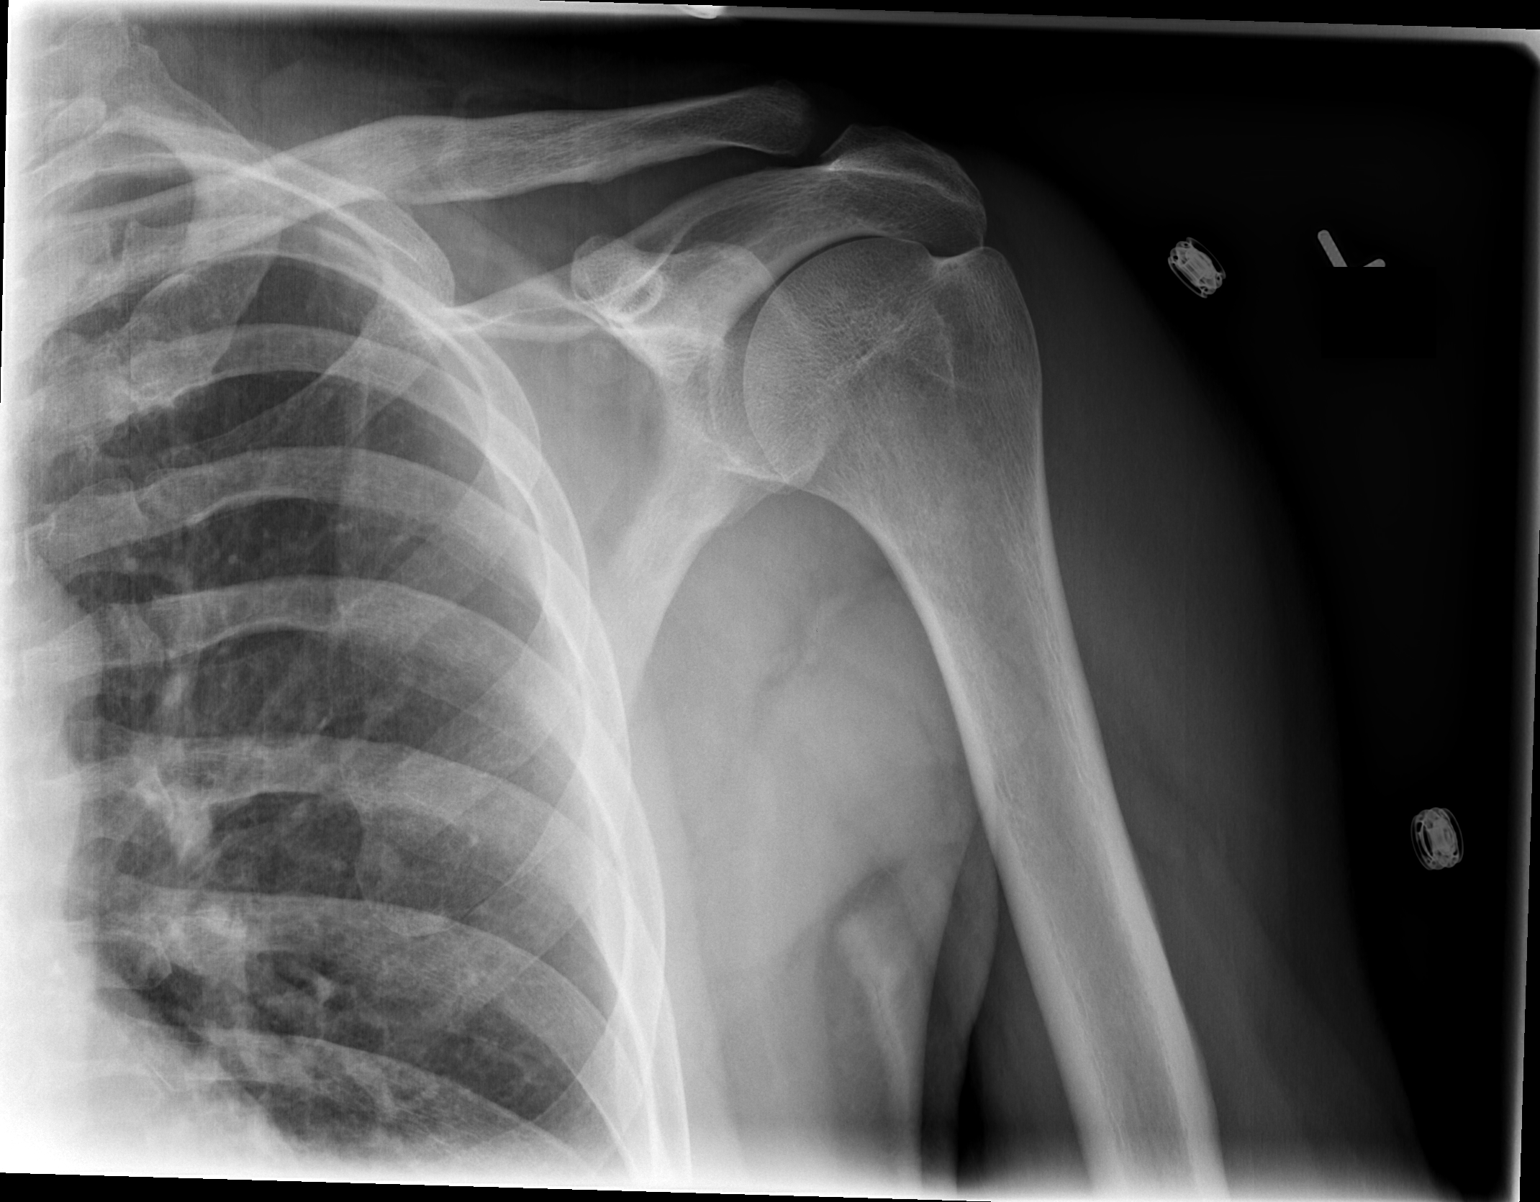

[w shoulder y view left]
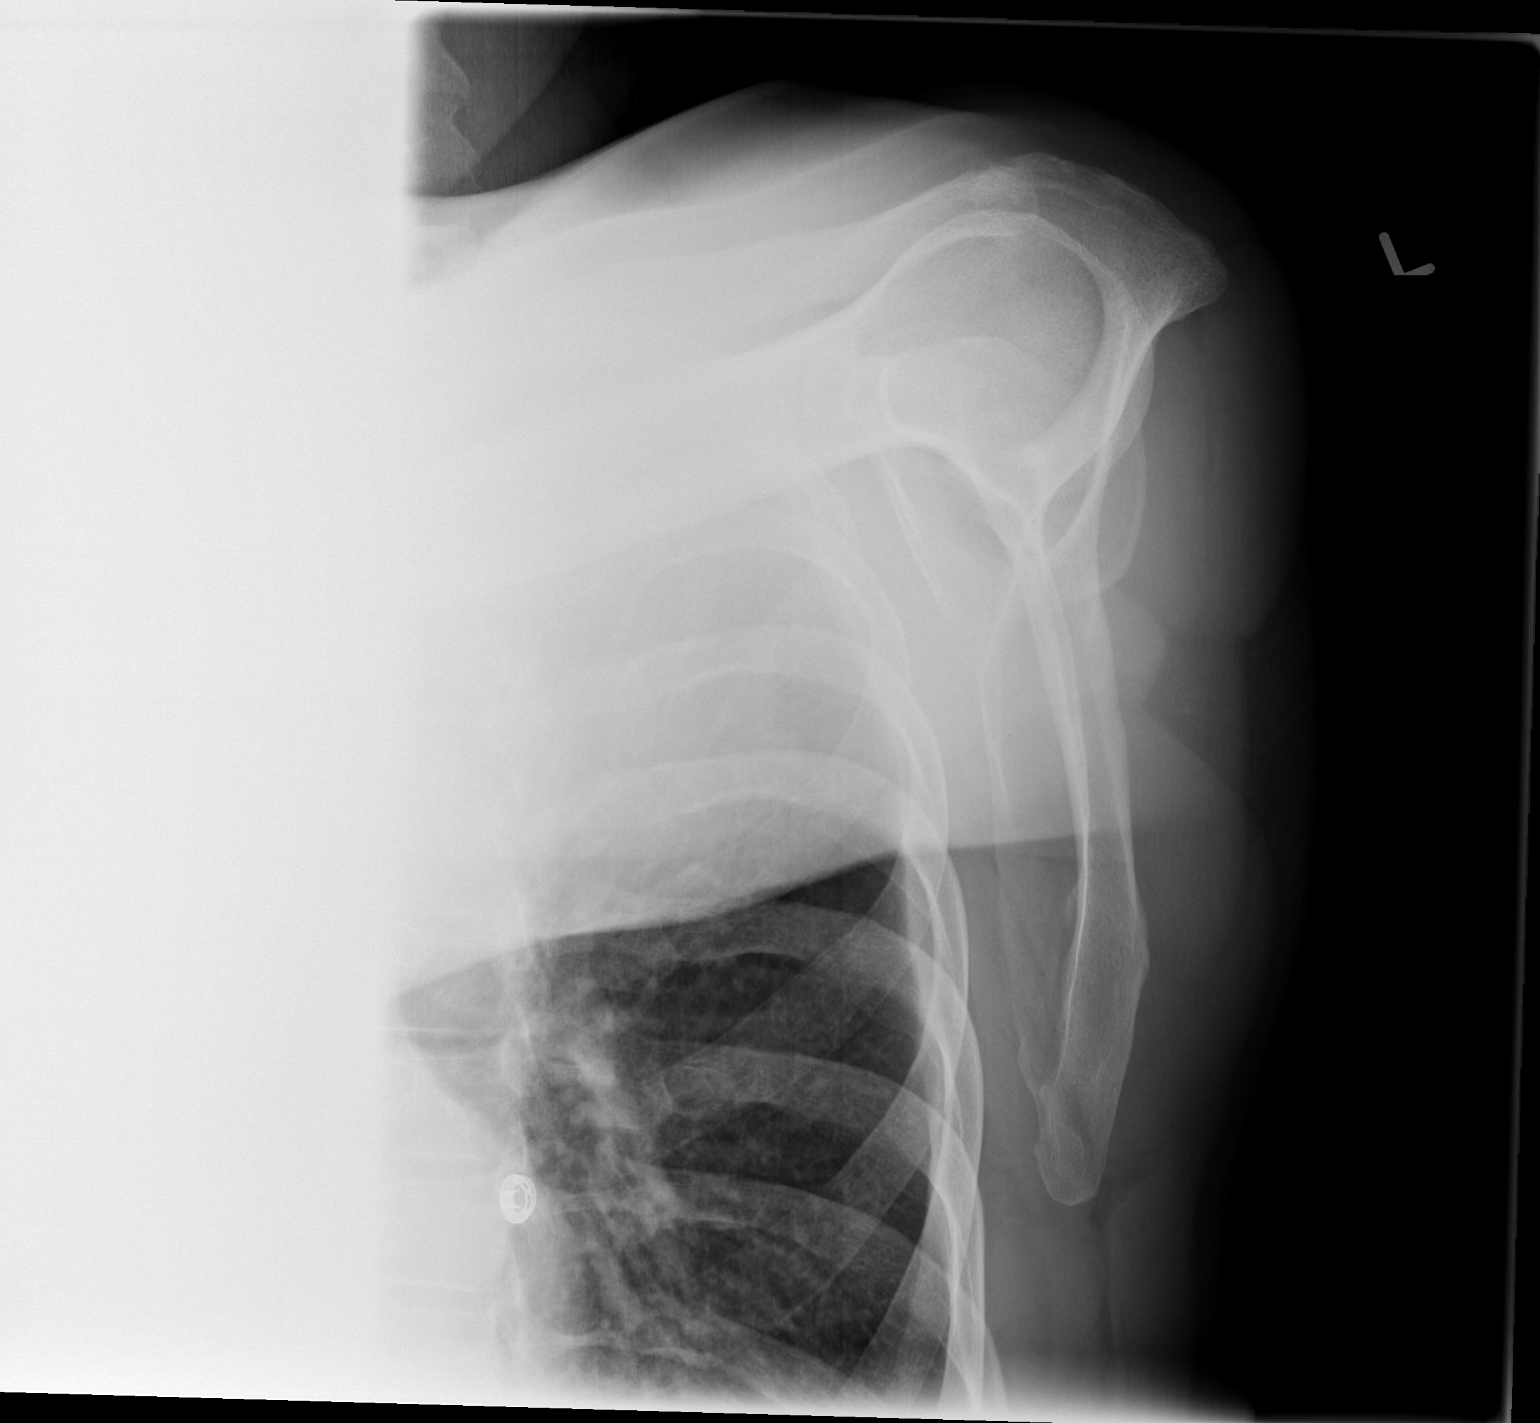

[3 of 3 positions shown; findings below may reference images not displayed]

FINDINGS: There is no fracture, dislocation, or soft tissue
calcification.  Old healed fracture of the left clavicle.
IMPRESSION: No acute abnormality.

## 2014-11-16 DIAGNOSIS — J0100 Acute maxillary sinusitis, unspecified: Secondary | ICD-10-CM | POA: Diagnosis not present

## 2014-11-16 DIAGNOSIS — J029 Acute pharyngitis, unspecified: Secondary | ICD-10-CM | POA: Diagnosis not present

## 2015-11-28 DIAGNOSIS — J309 Allergic rhinitis, unspecified: Secondary | ICD-10-CM | POA: Diagnosis not present

## 2016-06-23 ENCOUNTER — Emergency Department (HOSPITAL_COMMUNITY)
Admission: EM | Admit: 2016-06-23 | Discharge: 2016-06-24 | Disposition: A | Discharge status: Home / Self Care | Payer: Medicare Other | Attending: Emergency Medicine | Admitting: Emergency Medicine

## 2016-06-23 ENCOUNTER — Encounter (HOSPITAL_COMMUNITY): Payer: Self-pay | Admitting: Emergency Medicine

## 2016-06-23 DIAGNOSIS — Z79899 Other long term (current) drug therapy: Secondary | ICD-10-CM | POA: Insufficient documentation

## 2016-06-23 DIAGNOSIS — R0989 Other specified symptoms and signs involving the circulatory and respiratory systems: Secondary | ICD-10-CM | POA: Insufficient documentation

## 2016-06-23 MED ORDER — GLUCAGON HCL RDNA (DIAGNOSTIC) 1 MG IJ SOLR
1.0000 mg | Freq: Once | INTRAMUSCULAR | Status: AC
  Administered 2016-06-23: 1 mg via INTRAVENOUS
  Filled 2016-06-23: qty 1

## 2016-06-23 MED ORDER — LORAZEPAM 2 MG/ML IJ SOLN
0.5000 mg | Freq: Once | INTRAMUSCULAR | Status: AC
  Administered 2016-06-23: 0.5 mg via INTRAVENOUS
  Filled 2016-06-23: qty 1

## 2016-06-23 NOTE — ED Provider Notes (Signed)
[facility] DEPT Provider Note   CSN: [id number] Arrival date & time: 06/23/16  2052     History   Chief Complaint Chief Complaint  Patient presents with  . Foreign Body    HPI Anthony Lane is a 56 y.o. male.  HPI 56 year old Caucasian male with no significant past medical history presents to the emergency Department today with complaints of foreign body sensation in his throat. Patient states that approximately 1930 this evening he was eating barbecue and swallow and feels like it got lodged in his throat. Patient states that this has happened before and it usually resolves after he drinks coke. He tried Coca-Cola tonight and was unsuccessful. Patient states that he is able swallow a small amount of his sputum but has to spit up. States though atypical, and he had a symmetrical cold bathtub. He denies any difficulties breathing. Denies any shortness of breath. Denies any fever, chills, throat swelling sensation, hematemesis. Past Medical History:  Diagnosis Date  . Guillain-Barre syndrome ([place])     There are no active problems to display for this patient.   History reviewed. No pertinent surgical history.     Home Medications    Prior to Admission medications   Medication Sig Start Date End Date Taking? Authorizing Provider  HYDROcodone-acetaminophen (NORCO/VICODIN) 5-325 MG per tablet Take 1 tablet by mouth every 6 (six) hours as needed for pain. Patient not taking: Reported on 06/23/2016 04/25/12   Junius Creamer, NP    Family History History reviewed. No pertinent family history.  Social History Social History  Substance Use Topics  . Smoking status: Never Smoker  . Smokeless tobacco: Not on file  . Alcohol use No     Allergies   Patient has no known allergies.   Review of Systems Review of Systems  Constitutional: Negative for chills and fever.  HENT: Positive for trouble swallowing. Negative for congestion.   Respiratory: Negative for shortness of  breath.   Gastrointestinal: Negative for abdominal pain, diarrhea, nausea and vomiting.     Physical Exam Updated Vital Signs BP (!) 147/92 (BP Location: Left Arm)   Pulse 82   Temp 98.7 F (37.1 C) (Oral)   Resp 20   Ht 5\' 9"  (1.753 m)   Wt 91.6 kg (202 lb)   SpO2 99%   BMI 29.83 kg/m   Physical Exam  Constitutional: He appears well-developed and well-nourished. No distress.  HENT:  Head: Normocephalic and atraumatic.  Oropharynx clear. Patient is able swallow sputum but does spit a small amount backup. Speaking complete sentences and maintaining his airway.  Eyes: Right eye exhibits no discharge. Left eye exhibits no discharge. No scleral icterus.  Neck: Normal range of motion. Neck supple.  Pulmonary/Chest: Effort normal. No respiratory distress. He has no wheezes. He has no rales. He exhibits no tenderness.  Speaking in complete sentences and maintaining his airway.  Musculoskeletal: Normal range of motion.  Neurological: He is alert.  Skin: Skin is warm and dry. Capillary refill takes less than 2 seconds. No pallor.  Nursing note and vitals reviewed.    ED Treatments / Results  Labs (all labs ordered are listed, but only abnormal results are displayed) Labs Reviewed - No data to display  EKG  EKG Interpretation None       Radiology No results found.  Procedures Procedures (including critical care time)  Medications Ordered in ED Medications  glucagon (human recombinant) (GLUCAGEN) injection 1 mg (1 mg Intravenous Given 06/23/16 2306)  LORazepam (ATIVAN) injection  0.5 mg (0.5 mg Intravenous Given 06/23/16 2306)     Initial Impression / Assessment and Plan / ED Course  I have reviewed the triage vital signs and the nursing notes.  Pertinent labs & imaging results that were available during my care of the patient were reviewed by me and considered in my medical decision making (see chart for details).     Patient was asked to the emergency room today  with complaints of foreign body sensation in his throat. Patient reports trouble swallowing but is managing secretions at this time. We'll try glucagon, Ativan, soda. We'll reevaluate after this and determine need for further intervention by GI.  Meds were given. Patient was able to swallow Coke and food bolus was past. He feels much improved and ready for discharge. Vital signs remain stable. He is speaking complete sentences and maintaining his airway. Given GI follow up.   Pt is hemodynamically stable, in NAD, & able to ambulate in the ED. Pain has been managed & has no complaints prior to dc. Pt is comfortable with above plan and is stable for discharge at this time. All questions were answered prior to disposition. Strict return precautions for f/u to the ED were discussed.   Final Clinical Impressions(s) / ED Diagnoses   Final diagnoses:  Foreign body sensation in throat    New Prescriptions New Prescriptions   No medications on file     Aaron Edelman 06/23/16 2348    Veryl Speak, MD 06/26/16 [phone]

## 2016-06-23 NOTE — ED Triage Notes (Signed)
Pt reports eating at appx 1930 and feels like a piece is lodged in throat. Pt reports symptoms occurring before and was able to be resolved with coca-cola but tonight was unsuccessful. Pt currently alert and oriented x4. Pt reports having trouble swallowing at this time.

## 2016-06-23 NOTE — ED Notes (Signed)
Pt given a coke 

## 2016-06-23 NOTE — Discharge Instructions (Signed)
Return to the ED if he developed any worsening symptoms. Follow up with the GI doctor if symptoms persist.

## 2016-11-25 DIAGNOSIS — B355 Tinea imbricata: Secondary | ICD-10-CM | POA: Diagnosis not present

## 2017-02-02 DIAGNOSIS — R509 Fever, unspecified: Secondary | ICD-10-CM | POA: Diagnosis not present

## 2017-02-02 DIAGNOSIS — M791 Myalgia, unspecified site: Secondary | ICD-10-CM | POA: Diagnosis not present

## 2017-02-02 DIAGNOSIS — J039 Acute tonsillitis, unspecified: Secondary | ICD-10-CM | POA: Diagnosis not present

## 2018-01-14 DIAGNOSIS — J111 Influenza due to unidentified influenza virus with other respiratory manifestations: Secondary | ICD-10-CM | POA: Diagnosis not present

## 2018-03-30 DIAGNOSIS — R21 Rash and other nonspecific skin eruption: Secondary | ICD-10-CM | POA: Diagnosis not present

## 2019-04-13 DIAGNOSIS — Z7189 Other specified counseling: Secondary | ICD-10-CM | POA: Diagnosis not present

## 2019-04-13 DIAGNOSIS — Z20828 Contact with and (suspected) exposure to other viral communicable diseases: Secondary | ICD-10-CM | POA: Diagnosis not present

## 2019-09-01 DIAGNOSIS — C4441 Basal cell carcinoma of skin of scalp and neck: Secondary | ICD-10-CM | POA: Diagnosis not present

## 2019-09-01 DIAGNOSIS — D485 Neoplasm of uncertain behavior of skin: Secondary | ICD-10-CM | POA: Diagnosis not present

## 2019-09-01 DIAGNOSIS — L570 Actinic keratosis: Secondary | ICD-10-CM | POA: Diagnosis not present

## 2019-09-01 DIAGNOSIS — L821 Other seborrheic keratosis: Secondary | ICD-10-CM | POA: Diagnosis not present

## 2019-09-01 DIAGNOSIS — L819 Disorder of pigmentation, unspecified: Secondary | ICD-10-CM | POA: Diagnosis not present

## 2019-09-15 DIAGNOSIS — C4441 Basal cell carcinoma of skin of scalp and neck: Secondary | ICD-10-CM | POA: Diagnosis not present

## 2019-10-08 ENCOUNTER — Telehealth (HOSPITAL_COMMUNITY): Payer: Self-pay | Admitting: Nurse Practitioner

## 2019-10-08 NOTE — Telephone Encounter (Signed)
Called to discuss with Prentiss Bells Hingle about Covid symptoms and the use of casirivimab/imdevimab, a combination monoclonal antibody infusion for those with mild to moderate Covid symptoms and at a high risk of hospitalization.     Pt is qualified for this infusion at the [facility] infusion center due to co-morbid conditions (BMI and Guillain Barre). He is interested in infusion but is waiting to hear from his Neurologist before proceeding. Will call back if he decides to receive.   Alda Lea, AGPCNP-BC

## 2019-10-10 DIAGNOSIS — R112 Nausea with vomiting, unspecified: Secondary | ICD-10-CM | POA: Diagnosis not present

## 2019-10-10 DIAGNOSIS — R1111 Vomiting without nausea: Secondary | ICD-10-CM | POA: Diagnosis not present

## 2019-10-10 DIAGNOSIS — U071 COVID-19: Secondary | ICD-10-CM | POA: Diagnosis not present

## 2019-10-10 DIAGNOSIS — R000 Tachycardia, unspecified: Secondary | ICD-10-CM | POA: Diagnosis not present

## 2019-10-10 DIAGNOSIS — E860 Dehydration: Secondary | ICD-10-CM | POA: Diagnosis not present

## 2019-10-12 DIAGNOSIS — Z20828 Contact with and (suspected) exposure to other viral communicable diseases: Secondary | ICD-10-CM | POA: Diagnosis not present

## 2019-10-12 DIAGNOSIS — K591 Functional diarrhea: Secondary | ICD-10-CM | POA: Diagnosis not present

## 2019-10-14 DIAGNOSIS — U071 COVID-19: Secondary | ICD-10-CM | POA: Diagnosis not present

## 2019-11-23 DIAGNOSIS — M24549 Contracture, unspecified hand: Secondary | ICD-10-CM | POA: Diagnosis not present

## 2019-11-23 DIAGNOSIS — D6869 Other thrombophilia: Secondary | ICD-10-CM | POA: Diagnosis not present

## 2019-11-23 DIAGNOSIS — E663 Overweight: Secondary | ICD-10-CM | POA: Diagnosis not present

## 2019-11-23 DIAGNOSIS — Z8669 Personal history of other diseases of the nervous system and sense organs: Secondary | ICD-10-CM | POA: Diagnosis not present

## 2019-11-23 DIAGNOSIS — Z8616 Personal history of COVID-19: Secondary | ICD-10-CM | POA: Diagnosis not present

## 2020-02-20 DIAGNOSIS — E785 Hyperlipidemia, unspecified: Secondary | ICD-10-CM | POA: Diagnosis not present

## 2020-02-20 DIAGNOSIS — Z125 Encounter for screening for malignant neoplasm of prostate: Secondary | ICD-10-CM | POA: Diagnosis not present

## 2020-02-27 DIAGNOSIS — E785 Hyperlipidemia, unspecified: Secondary | ICD-10-CM | POA: Diagnosis not present

## 2020-02-27 DIAGNOSIS — R7989 Other specified abnormal findings of blood chemistry: Secondary | ICD-10-CM | POA: Diagnosis not present

## 2020-02-27 DIAGNOSIS — Z7189 Other specified counseling: Secondary | ICD-10-CM | POA: Diagnosis not present

## 2020-02-27 DIAGNOSIS — Z8669 Personal history of other diseases of the nervous system and sense organs: Secondary | ICD-10-CM | POA: Diagnosis not present

## 2020-02-27 DIAGNOSIS — M24549 Contracture, unspecified hand: Secondary | ICD-10-CM | POA: Diagnosis not present

## 2020-02-27 DIAGNOSIS — E663 Overweight: Secondary | ICD-10-CM | POA: Diagnosis not present

## 2020-02-27 DIAGNOSIS — Z1331 Encounter for screening for depression: Secondary | ICD-10-CM | POA: Diagnosis not present

## 2020-02-27 DIAGNOSIS — Z0000 Encounter for general adult medical examination without abnormal findings: Secondary | ICD-10-CM | POA: Diagnosis not present

## 2020-06-24 DIAGNOSIS — H10212 Acute toxic conjunctivitis, left eye: Secondary | ICD-10-CM | POA: Diagnosis not present

## 2020-08-16 DIAGNOSIS — U071 COVID-19: Secondary | ICD-10-CM | POA: Diagnosis not present

## 2020-08-22 ENCOUNTER — Ambulatory Visit: Payer: Medicare Other | Admitting: Neurology

## 2020-09-03 ENCOUNTER — Ambulatory Visit (INDEPENDENT_AMBULATORY_CARE_PROVIDER_SITE_OTHER): Payer: Medicare Other | Admitting: Neurology

## 2020-09-03 ENCOUNTER — Encounter: Payer: Self-pay | Admitting: Neurology

## 2020-09-03 ENCOUNTER — Other Ambulatory Visit: Payer: Self-pay

## 2020-09-03 VITALS — BP 122/78 | HR 79 | Ht 69.0 in | Wt 189.0 lb

## 2020-09-03 DIAGNOSIS — Z8669 Personal history of other diseases of the nervous system and sense organs: Secondary | ICD-10-CM | POA: Diagnosis not present

## 2020-09-03 DIAGNOSIS — U071 COVID-19: Secondary | ICD-10-CM | POA: Diagnosis not present

## 2020-09-03 NOTE — Progress Notes (Signed)
[facility] [address] [place]. [place] [address] [phone]       OFFICE CONSULT NOTE  [name] Date of Birth:  [birth year] Medical Record Number:  [id number]   Referring MD: Imagene Sheller  Reason for Referral: History of GBS and risk of COVID vaccination  HPI: Anthony Lane is a pleasant 60 year old Caucasian male seen today for initial office consultation visit.  He is unaccompanied today.  History is obtained from him and no prior imaging studies or significant medical records available for review.  Patient has no significant past medical history except remote history of Guillain-Barr syndrome in 2005.  He was actually seen by me at that time and initially admitted to [facility] and then transferred to [facility].  He had a preceding GI illness 1 week prior to admission..  He had quadriparesis but did not require any ventilatory support.  He was hospitalized for nearly 30 days and had prolonged recovery with rehabilitation.  MRI scan of the brain at that time showed no acute abnormality and only nonspecific hyperintensity.  MRI of the C-spine showed small paracentral herniated disc at C4-5 but no cord compression.  Spinal tap showed elevated CSF proteins but otherwise unremarkable.  ESR was mildly elevated ANA, RPR, HIV nonreactive.  He was treated with IVIG 5 doses and transferred to inpatient rehab.  He still has some residual weakness in his hand muscles with flexion contractures but can ambulate independently without any assistance.  He has not had any flareup or worsening of his GBS over the years.  Patient has been reluctant in getting flu vaccines every year and now with recent COVID he has questions about relationship of COVID vaccination to possible exacerbation of his GBS.  In fact had COVID infection in November 2021 which predominantly upper respite tract symptoms but these were mild and he was not hospitalized and has recovered fully.  He  denies any increase in weakness, tingling, numbness, worsening gait balance or fatigue.  He in fact has no active neurological complaints at this time.  Patient had a history of brief hospitalization in January  ROS:   14 system review of systems is positive for tingling, numbness, weakness all other systems negative  PMH:  Past Medical History:  Diagnosis Date   Guillain-Barre syndrome ([place])     Social History:  Social History   Socioeconomic History   Marital status: Married    Spouse name: [name]   Number of children: Not on file   Years of education: Not on file   Highest education level: Not on file  Occupational History   Not on file  Tobacco Use   Smoking status: Never   Smokeless tobacco: Never  Substance and Sexual Activity   Alcohol use: No   Drug use: No   Sexual activity: Not on file  Other Topics Concern   Not on file  Social History Narrative   Lives with wife and 3 daughters   Right Handed   Drinks soda occassionally   Social Determinants of Health   Financial Resource Strain: Not on file  Food Insecurity: Not on file  Transportation Needs: Not on file  Physical Activity: Not on file  Stress: Not on file  Social Connections: Not on file  Intimate Partner Violence: Not on file    Medications:   Current Outpatient Medications on File Prior to Visit  Medication Sig Dispense Refill   Multiple Vitamins-Minerals (MULTIVITAMIN WITH MINERALS) tablet Take 1 tablet by  mouth daily.     No current facility-administered medications on file prior to visit.    Allergies:  No Known Allergies  Physical Exam General: well developed, well nourished pleasant middle-age Caucasian male, seated, in no evident distress Head: head normocephalic and atraumatic.   Neck: supple with no carotid or supraclavicular bruits Cardiovascular: regular rate and rhythm, no murmurs Musculoskeletal: no deformity Skin:  no rash/petichiae Vascular:  Normal pulses all  extremities  Neurologic Exam Mental Status: Awake and fully alert. Oriented to place and time. Recent and remote memory intact. Attention span, concentration and fund of knowledge appropriate. Mood and affect appropriate.  Cranial Nerves: Fundoscopic exam reveals sharp disc margins. Pupils equal, briskly reactive to light. Extraocular movements full without nystagmus. Visual fields full to confrontation. Hearing intact. Facial sensation intact. Face, tongue, palate moves normally and symmetrically.  Motor: Normal bulk and tone. Normal strength in all tested extremity muscles.  Wasting of intrinsic hand muscles bilaterally with fixed flexion contractures of the fingers.  Weakness of intrinsic hand muscles right greater than left hand but good strength at the wrist, elbow and shoulders bilaterally mild weakness of ankle dorsiflexors bilaterally with foot drop.  Mild intermittent fine tremor of upper extremity left greater than right. Sensory.: intact to touch , pinprick , position and vibratory sensation.  Coordination: Rapid alternating movements normal in all extremities. Finger-to-nose and heel-to-shin performed accurately bilaterally. Gait and Station: Arises from chair without difficulty. Stance is normal. Gait demonstrates normal stride length and balance but bilateral mild foot drop.  Mild difficulty with walking on his heels but can walk on his toes  reflexes: 1+ and symmetric.  Except both supinator and right bicep jerk is depressed.  Toes downgoing.       ASSESSMENT: 60 year old Caucasian male with remote history of Guillain-Barr syndrome in 2005 with mild residual hand weakness and recent history of COVID infection in November 2021 with concerns about risk of COVID and other vaccinations affecting his GBS.  No active new neurological complaints.     PLAN: I had a long discussion with the patient regarding his remote history of Guillain-Barr syndrome and recent COVID infection and the  risk of neuromuscular worsening following risk of COVID or flu flu vaccinations and answered questions.  I reviewed CDC guidelines about GBS and vaccines and relationship of GBS after COVID-vaccine and answered questions.  This is being carefully monitored and so for the clinical guidelines still recommend COVID vaccination for patients with remote history of GBS.  Since she has had recent COVID infection he needs to wait at least 6 months for his natural immunity antibodies to decline before considering COVID vaccination.  I gave him literature from the [facility] website to review.  No further neurological testing or follow-up is necessary at the present time.  Greater than 50% time during this 45-minute consultation visit was spent on counseling and coordination of care about his remote history of GBS as well as recent COVID and the risk of worsening of his symptoms following COVID vaccination or other vaccination and answering questions. Antony Contras, MD  Note: This document was prepared with digital dictation and possible smart phrase technology. Any transcriptional errors that result from this process are unintentional.

## 2020-09-03 NOTE — Patient Instructions (Signed)
I had a long discussion with the patient regarding his remote history of Guillain-Barr syndrome and recent COVID infection and the risk of neuromuscular worsening following risk of COVID or flu flu vaccinations and answered questions.  I reviewed CDC guidelines about GBS and vaccines and relationship of GBS after COVID-vaccine and answered questions.  This is being carefully monitored and so for the clinical guidelines still recommend COVID vaccination for patients with remote history of GBS.  Since she has had recent COVID infection he needs to wait at least 6 months for his natural immunity antibodies to decline before considering COVID vaccination.  I gave him literature from the [facility] website to review.  No further neurological testing or follow-up is necessary at the present time.

## 2020-10-15 DIAGNOSIS — L821 Other seborrheic keratosis: Secondary | ICD-10-CM | POA: Diagnosis not present

## 2020-10-15 DIAGNOSIS — L820 Inflamed seborrheic keratosis: Secondary | ICD-10-CM | POA: Diagnosis not present

## 2020-10-15 DIAGNOSIS — D492 Neoplasm of unspecified behavior of bone, soft tissue, and skin: Secondary | ICD-10-CM | POA: Diagnosis not present

## 2020-10-15 DIAGNOSIS — L814 Other melanin hyperpigmentation: Secondary | ICD-10-CM | POA: Diagnosis not present

## 2020-10-15 DIAGNOSIS — D225 Melanocytic nevi of trunk: Secondary | ICD-10-CM | POA: Diagnosis not present

## 2020-11-07 DIAGNOSIS — R7989 Other specified abnormal findings of blood chemistry: Secondary | ICD-10-CM | POA: Diagnosis not present

## 2021-03-12 DIAGNOSIS — Z0000 Encounter for general adult medical examination without abnormal findings: Secondary | ICD-10-CM | POA: Diagnosis not present

## 2021-03-12 DIAGNOSIS — Z8669 Personal history of other diseases of the nervous system and sense organs: Secondary | ICD-10-CM | POA: Diagnosis not present

## 2021-03-12 DIAGNOSIS — E785 Hyperlipidemia, unspecified: Secondary | ICD-10-CM | POA: Diagnosis not present

## 2021-03-12 DIAGNOSIS — Z125 Encounter for screening for malignant neoplasm of prostate: Secondary | ICD-10-CM | POA: Diagnosis not present

## 2021-03-12 DIAGNOSIS — R7989 Other specified abnormal findings of blood chemistry: Secondary | ICD-10-CM | POA: Diagnosis not present

## 2021-03-12 DIAGNOSIS — R7301 Impaired fasting glucose: Secondary | ICD-10-CM | POA: Diagnosis not present

## 2021-03-13 ENCOUNTER — Other Ambulatory Visit: Payer: Self-pay | Admitting: Internal Medicine

## 2021-03-13 DIAGNOSIS — E785 Hyperlipidemia, unspecified: Secondary | ICD-10-CM

## 2021-03-19 DIAGNOSIS — L821 Other seborrheic keratosis: Secondary | ICD-10-CM | POA: Diagnosis not present

## 2021-03-19 DIAGNOSIS — Z85828 Personal history of other malignant neoplasm of skin: Secondary | ICD-10-CM | POA: Diagnosis not present

## 2021-03-19 DIAGNOSIS — L814 Other melanin hyperpigmentation: Secondary | ICD-10-CM | POA: Diagnosis not present

## 2021-03-19 DIAGNOSIS — Z08 Encounter for follow-up examination after completed treatment for malignant neoplasm: Secondary | ICD-10-CM | POA: Diagnosis not present

## 2021-03-19 DIAGNOSIS — L812 Freckles: Secondary | ICD-10-CM | POA: Diagnosis not present

## 2021-03-19 DIAGNOSIS — D225 Melanocytic nevi of trunk: Secondary | ICD-10-CM | POA: Diagnosis not present

## 2021-03-19 DIAGNOSIS — L570 Actinic keratosis: Secondary | ICD-10-CM | POA: Diagnosis not present

## 2021-03-19 DIAGNOSIS — L819 Disorder of pigmentation, unspecified: Secondary | ICD-10-CM | POA: Diagnosis not present

## 2021-08-12 DIAGNOSIS — D485 Neoplasm of uncertain behavior of skin: Secondary | ICD-10-CM | POA: Diagnosis not present

## 2021-08-12 DIAGNOSIS — R58 Hemorrhage, not elsewhere classified: Secondary | ICD-10-CM | POA: Diagnosis not present

## 2021-08-12 DIAGNOSIS — R208 Other disturbances of skin sensation: Secondary | ICD-10-CM | POA: Diagnosis not present

## 2021-08-12 DIAGNOSIS — L723 Sebaceous cyst: Secondary | ICD-10-CM | POA: Diagnosis not present

## 2021-09-18 DIAGNOSIS — Z8669 Personal history of other diseases of the nervous system and sense organs: Secondary | ICD-10-CM | POA: Diagnosis not present

## 2021-09-18 DIAGNOSIS — W57XXXA Bitten or stung by nonvenomous insect and other nonvenomous arthropods, initial encounter: Secondary | ICD-10-CM | POA: Diagnosis not present

## 2021-09-18 DIAGNOSIS — M2550 Pain in unspecified joint: Secondary | ICD-10-CM | POA: Diagnosis not present

## 2021-09-18 DIAGNOSIS — S30861A Insect bite (nonvenomous) of abdominal wall, initial encounter: Secondary | ICD-10-CM | POA: Diagnosis not present

## 2021-09-25 DIAGNOSIS — S30861A Insect bite (nonvenomous) of abdominal wall, initial encounter: Secondary | ICD-10-CM | POA: Diagnosis not present

## 2022-02-13 DIAGNOSIS — D492 Neoplasm of unspecified behavior of bone, soft tissue, and skin: Secondary | ICD-10-CM | POA: Diagnosis not present

## 2022-02-13 DIAGNOSIS — R58 Hemorrhage, not elsewhere classified: Secondary | ICD-10-CM | POA: Diagnosis not present

## 2022-02-13 DIAGNOSIS — C44329 Squamous cell carcinoma of skin of other parts of face: Secondary | ICD-10-CM | POA: Diagnosis not present

## 2022-02-13 DIAGNOSIS — R208 Other disturbances of skin sensation: Secondary | ICD-10-CM | POA: Diagnosis not present

## 2022-02-13 DIAGNOSIS — L570 Actinic keratosis: Secondary | ICD-10-CM | POA: Diagnosis not present

## 2022-02-13 DIAGNOSIS — L538 Other specified erythematous conditions: Secondary | ICD-10-CM | POA: Diagnosis not present

## 2022-02-20 DIAGNOSIS — C44329 Squamous cell carcinoma of skin of other parts of face: Secondary | ICD-10-CM | POA: Diagnosis not present

## 2022-03-12 DIAGNOSIS — R7303 Prediabetes: Secondary | ICD-10-CM | POA: Diagnosis not present

## 2022-03-12 DIAGNOSIS — R7989 Other specified abnormal findings of blood chemistry: Secondary | ICD-10-CM | POA: Diagnosis not present

## 2022-03-12 DIAGNOSIS — E785 Hyperlipidemia, unspecified: Secondary | ICD-10-CM | POA: Diagnosis not present

## 2022-03-12 DIAGNOSIS — Z125 Encounter for screening for malignant neoplasm of prostate: Secondary | ICD-10-CM | POA: Diagnosis not present

## 2022-03-19 DIAGNOSIS — Z0000 Encounter for general adult medical examination without abnormal findings: Secondary | ICD-10-CM | POA: Diagnosis not present

## 2022-03-19 DIAGNOSIS — R7989 Other specified abnormal findings of blood chemistry: Secondary | ICD-10-CM | POA: Diagnosis not present

## 2022-03-19 DIAGNOSIS — E663 Overweight: Secondary | ICD-10-CM | POA: Diagnosis not present

## 2022-03-19 DIAGNOSIS — E1169 Type 2 diabetes mellitus with other specified complication: Secondary | ICD-10-CM | POA: Diagnosis not present

## 2022-03-19 DIAGNOSIS — M24549 Contracture, unspecified hand: Secondary | ICD-10-CM | POA: Diagnosis not present

## 2022-03-19 DIAGNOSIS — Z8669 Personal history of other diseases of the nervous system and sense organs: Secondary | ICD-10-CM | POA: Diagnosis not present

## 2022-03-19 DIAGNOSIS — D510 Vitamin B12 deficiency anemia due to intrinsic factor deficiency: Secondary | ICD-10-CM | POA: Diagnosis not present

## 2022-03-19 DIAGNOSIS — E785 Hyperlipidemia, unspecified: Secondary | ICD-10-CM | POA: Diagnosis not present

## 2022-03-19 DIAGNOSIS — Z1331 Encounter for screening for depression: Secondary | ICD-10-CM | POA: Diagnosis not present

## 2022-03-19 DIAGNOSIS — R82998 Other abnormal findings in urine: Secondary | ICD-10-CM | POA: Diagnosis not present

## 2022-03-20 DIAGNOSIS — Z08 Encounter for follow-up examination after completed treatment for malignant neoplasm: Secondary | ICD-10-CM | POA: Diagnosis not present

## 2022-03-20 DIAGNOSIS — L570 Actinic keratosis: Secondary | ICD-10-CM | POA: Diagnosis not present

## 2022-03-20 DIAGNOSIS — L821 Other seborrheic keratosis: Secondary | ICD-10-CM | POA: Diagnosis not present

## 2022-03-20 DIAGNOSIS — L814 Other melanin hyperpigmentation: Secondary | ICD-10-CM | POA: Diagnosis not present

## 2022-03-20 DIAGNOSIS — Z85828 Personal history of other malignant neoplasm of skin: Secondary | ICD-10-CM | POA: Diagnosis not present

## 2022-03-20 DIAGNOSIS — L905 Scar conditions and fibrosis of skin: Secondary | ICD-10-CM | POA: Diagnosis not present

## 2022-03-20 DIAGNOSIS — D225 Melanocytic nevi of trunk: Secondary | ICD-10-CM | POA: Diagnosis not present

## 2022-04-08 DIAGNOSIS — Z1211 Encounter for screening for malignant neoplasm of colon: Secondary | ICD-10-CM | POA: Diagnosis not present

## 2022-09-25 DIAGNOSIS — L821 Other seborrheic keratosis: Secondary | ICD-10-CM | POA: Diagnosis not present

## 2022-09-25 DIAGNOSIS — L578 Other skin changes due to chronic exposure to nonionizing radiation: Secondary | ICD-10-CM | POA: Diagnosis not present

## 2022-09-25 DIAGNOSIS — L905 Scar conditions and fibrosis of skin: Secondary | ICD-10-CM | POA: Diagnosis not present

## 2022-09-25 DIAGNOSIS — D235 Other benign neoplasm of skin of trunk: Secondary | ICD-10-CM | POA: Diagnosis not present

## 2022-09-25 DIAGNOSIS — Z08 Encounter for follow-up examination after completed treatment for malignant neoplasm: Secondary | ICD-10-CM | POA: Diagnosis not present

## 2022-09-25 DIAGNOSIS — Z85828 Personal history of other malignant neoplasm of skin: Secondary | ICD-10-CM | POA: Diagnosis not present

## 2022-09-25 DIAGNOSIS — L570 Actinic keratosis: Secondary | ICD-10-CM | POA: Diagnosis not present

## 2022-09-25 DIAGNOSIS — L814 Other melanin hyperpigmentation: Secondary | ICD-10-CM | POA: Diagnosis not present

## 2022-10-30 DIAGNOSIS — E1169 Type 2 diabetes mellitus with other specified complication: Secondary | ICD-10-CM | POA: Diagnosis not present

## 2022-10-30 DIAGNOSIS — Z0000 Encounter for general adult medical examination without abnormal findings: Secondary | ICD-10-CM | POA: Diagnosis not present

## 2022-10-30 DIAGNOSIS — Z1389 Encounter for screening for other disorder: Secondary | ICD-10-CM | POA: Diagnosis not present

## 2022-10-30 DIAGNOSIS — E663 Overweight: Secondary | ICD-10-CM | POA: Diagnosis not present

## 2022-10-30 DIAGNOSIS — M24549 Contracture, unspecified hand: Secondary | ICD-10-CM | POA: Diagnosis not present

## 2022-10-30 DIAGNOSIS — R7989 Other specified abnormal findings of blood chemistry: Secondary | ICD-10-CM | POA: Diagnosis not present

## 2022-10-30 DIAGNOSIS — Z8669 Personal history of other diseases of the nervous system and sense organs: Secondary | ICD-10-CM | POA: Diagnosis not present

## 2022-10-30 DIAGNOSIS — E785 Hyperlipidemia, unspecified: Secondary | ICD-10-CM | POA: Diagnosis not present

## 2022-11-13 DIAGNOSIS — L570 Actinic keratosis: Secondary | ICD-10-CM | POA: Diagnosis not present

## 2022-11-13 DIAGNOSIS — L578 Other skin changes due to chronic exposure to nonionizing radiation: Secondary | ICD-10-CM | POA: Diagnosis not present

## 2023-03-19 DIAGNOSIS — E785 Hyperlipidemia, unspecified: Secondary | ICD-10-CM | POA: Diagnosis not present

## 2023-03-19 DIAGNOSIS — E1169 Type 2 diabetes mellitus with other specified complication: Secondary | ICD-10-CM | POA: Diagnosis not present

## 2023-03-19 DIAGNOSIS — Z125 Encounter for screening for malignant neoplasm of prostate: Secondary | ICD-10-CM | POA: Diagnosis not present

## 2023-03-19 DIAGNOSIS — R7989 Other specified abnormal findings of blood chemistry: Secondary | ICD-10-CM | POA: Diagnosis not present

## 2023-04-10 DIAGNOSIS — R82998 Other abnormal findings in urine: Secondary | ICD-10-CM | POA: Diagnosis not present

## 2023-04-10 DIAGNOSIS — E785 Hyperlipidemia, unspecified: Secondary | ICD-10-CM | POA: Diagnosis not present

## 2023-04-10 DIAGNOSIS — M24549 Contracture, unspecified hand: Secondary | ICD-10-CM | POA: Diagnosis not present

## 2023-04-10 DIAGNOSIS — Z1331 Encounter for screening for depression: Secondary | ICD-10-CM | POA: Diagnosis not present

## 2023-04-10 DIAGNOSIS — R7989 Other specified abnormal findings of blood chemistry: Secondary | ICD-10-CM | POA: Diagnosis not present

## 2023-04-10 DIAGNOSIS — E1169 Type 2 diabetes mellitus with other specified complication: Secondary | ICD-10-CM | POA: Diagnosis not present

## 2023-04-10 DIAGNOSIS — Z0000 Encounter for general adult medical examination without abnormal findings: Secondary | ICD-10-CM | POA: Diagnosis not present

## 2023-04-10 DIAGNOSIS — G629 Polyneuropathy, unspecified: Secondary | ICD-10-CM | POA: Diagnosis not present

## 2023-04-10 DIAGNOSIS — Z8669 Personal history of other diseases of the nervous system and sense organs: Secondary | ICD-10-CM | POA: Diagnosis not present

## 2023-04-10 DIAGNOSIS — E663 Overweight: Secondary | ICD-10-CM | POA: Diagnosis not present

## 2023-05-13 DIAGNOSIS — L814 Other melanin hyperpigmentation: Secondary | ICD-10-CM | POA: Diagnosis not present

## 2023-05-13 DIAGNOSIS — D492 Neoplasm of unspecified behavior of bone, soft tissue, and skin: Secondary | ICD-10-CM | POA: Diagnosis not present

## 2023-05-13 DIAGNOSIS — D225 Melanocytic nevi of trunk: Secondary | ICD-10-CM | POA: Diagnosis not present

## 2023-05-13 DIAGNOSIS — L821 Other seborrheic keratosis: Secondary | ICD-10-CM | POA: Diagnosis not present

## 2023-05-13 DIAGNOSIS — C44519 Basal cell carcinoma of skin of other part of trunk: Secondary | ICD-10-CM | POA: Diagnosis not present

## 2023-05-13 DIAGNOSIS — L905 Scar conditions and fibrosis of skin: Secondary | ICD-10-CM | POA: Diagnosis not present

## 2023-05-13 DIAGNOSIS — Z08 Encounter for follow-up examination after completed treatment for malignant neoplasm: Secondary | ICD-10-CM | POA: Diagnosis not present

## 2023-05-13 DIAGNOSIS — L570 Actinic keratosis: Secondary | ICD-10-CM | POA: Diagnosis not present

## 2023-05-13 DIAGNOSIS — Z85828 Personal history of other malignant neoplasm of skin: Secondary | ICD-10-CM | POA: Diagnosis not present

## 2023-05-25 DIAGNOSIS — C4441 Basal cell carcinoma of skin of scalp and neck: Secondary | ICD-10-CM | POA: Diagnosis not present

## 2023-05-29 ENCOUNTER — Encounter: Payer: Self-pay | Admitting: Oncology

## 2023-05-29 ENCOUNTER — Inpatient Hospital Stay

## 2023-05-29 ENCOUNTER — Inpatient Hospital Stay: Attending: Oncology | Admitting: Oncology

## 2023-05-29 VITALS — BP 109/67 | HR 77 | Temp 98.1°F | Resp 17 | Ht 69.0 in | Wt 185.7 lb

## 2023-05-29 DIAGNOSIS — E119 Type 2 diabetes mellitus without complications: Secondary | ICD-10-CM | POA: Insufficient documentation

## 2023-05-29 DIAGNOSIS — Z7984 Long term (current) use of oral hypoglycemic drugs: Secondary | ICD-10-CM | POA: Insufficient documentation

## 2023-05-29 DIAGNOSIS — E785 Hyperlipidemia, unspecified: Secondary | ICD-10-CM | POA: Diagnosis not present

## 2023-05-29 DIAGNOSIS — R5382 Chronic fatigue, unspecified: Secondary | ICD-10-CM

## 2023-05-29 DIAGNOSIS — R531 Weakness: Secondary | ICD-10-CM | POA: Insufficient documentation

## 2023-05-29 DIAGNOSIS — R7989 Other specified abnormal findings of blood chemistry: Secondary | ICD-10-CM | POA: Insufficient documentation

## 2023-05-29 DIAGNOSIS — G629 Polyneuropathy, unspecified: Secondary | ICD-10-CM

## 2023-05-29 DIAGNOSIS — A6920 Lyme disease, unspecified: Secondary | ICD-10-CM | POA: Insufficient documentation

## 2023-05-29 DIAGNOSIS — Z8616 Personal history of COVID-19: Secondary | ICD-10-CM | POA: Diagnosis not present

## 2023-05-29 DIAGNOSIS — R7982 Elevated C-reactive protein (CRP): Secondary | ICD-10-CM | POA: Diagnosis not present

## 2023-05-29 DIAGNOSIS — Z79899 Other long term (current) drug therapy: Secondary | ICD-10-CM | POA: Insufficient documentation

## 2023-05-29 DIAGNOSIS — G610 Guillain-Barre syndrome: Secondary | ICD-10-CM | POA: Diagnosis not present

## 2023-05-29 LAB — SEDIMENTATION RATE: Sed Rate: 2 mm/h (ref 0–16)

## 2023-05-29 LAB — CBC WITH DIFFERENTIAL (CANCER CENTER ONLY)
Abs Immature Granulocytes: 0.02 10*3/uL (ref 0.00–0.07)
Basophils Absolute: 0 10*3/uL (ref 0.0–0.1)
Basophils Relative: 1 %
Eosinophils Absolute: 0.3 10*3/uL (ref 0.0–0.5)
Eosinophils Relative: 5 %
HCT: 44 % (ref 39.0–52.0)
Hemoglobin: 15.8 g/dL (ref 13.0–17.0)
Immature Granulocytes: 0 %
Lymphocytes Relative: 40 %
Lymphs Abs: 2.4 10*3/uL (ref 0.7–4.0)
MCH: 32.6 pg (ref 26.0–34.0)
MCHC: 35.9 g/dL (ref 30.0–36.0)
MCV: 90.9 fL (ref 80.0–100.0)
Monocytes Absolute: 0.5 10*3/uL (ref 0.1–1.0)
Monocytes Relative: 8 %
Neutro Abs: 2.8 10*3/uL (ref 1.7–7.7)
Neutrophils Relative %: 46 %
Platelet Count: 209 10*3/uL (ref 150–400)
RBC: 4.84 MIL/uL (ref 4.22–5.81)
RDW: 11.9 % (ref 11.5–15.5)
WBC Count: 6 10*3/uL (ref 4.0–10.5)
nRBC: 0 % (ref 0.0–0.2)

## 2023-05-29 LAB — CMP (CANCER CENTER ONLY)
ALT: 41 U/L (ref 0–44)
AST: 23 U/L (ref 15–41)
Albumin: 4.5 g/dL (ref 3.5–5.0)
Alkaline Phosphatase: 41 U/L (ref 38–126)
Anion gap: 6 (ref 5–15)
BUN: 19 mg/dL (ref 8–23)
CO2: 28 mmol/L (ref 22–32)
Calcium: 9.5 mg/dL (ref 8.9–10.3)
Chloride: 104 mmol/L (ref 98–111)
Creatinine: 0.97 mg/dL (ref 0.61–1.24)
GFR, Estimated: >60 mL/min (ref >60)
Glucose, Bld: 110 mg/dL — ABNORMAL HIGH (ref 70–99)
Potassium: 4 mmol/L (ref 3.5–5.1)
Sodium: 138 mmol/L (ref 135–145)
Total Bilirubin: 0.5 mg/dL (ref 0.0–1.2)
Total Protein: 7.6 g/dL (ref 6.5–8.1)

## 2023-05-29 LAB — IRON AND IRON BINDING CAPACITY (CC-WL,HP ONLY)
Iron: 114 ug/dL (ref 45–182)
Saturation Ratios: 36 % (ref 17.9–39.5)
TIBC: 319 ug/dL (ref 250–450)
UIBC: 205 ug/dL (ref 117–376)

## 2023-05-29 LAB — LACTATE DEHYDROGENASE: LDH: 130 U/L (ref 98–192)

## 2023-05-29 LAB — C-REACTIVE PROTEIN: CRP: 1.2 mg/dL — ABNORMAL HIGH (ref <1.0)

## 2023-05-29 LAB — TSH: TSH: 2.75 u[IU]/mL (ref 0.350–4.500)

## 2023-05-29 LAB — FERRITIN: Ferritin: 493 ng/mL — ABNORMAL HIGH (ref 24–336)

## 2023-05-29 NOTE — Assessment & Plan Note (Signed)
 Mildly elevated ferritin levels since March 2024, recently decreased from 577 to 483. Differential diagnosis includes hemochromatosis and inflammation.   Hemochromatosis is less likely due to age and lack of family history, but genetic testing for hemochromatosis mutations (C282Y, H63D, S65C) is planned. Inflammation from conditions like arthritis or diabetes could also contribute to elevated ferritin.   If hemochromatosis is confirmed, treatment involves phlebotomy to manage ferritin levels, with frequency adjusted based on severity and organ involvement.  Labs today showed unremarkable CBCD and CMP.  Iron studies also are within normal limits with iron saturation of 36%.  LDH normal.  Sed rate normal at 2 mm/h.  Ferritin pending.  - Ordered genetic testing for hemochromatosis mutations (C282Y, H63D, S65C)  - Arrange follow-up phone call in two weeks to discuss test results  - Plan to see him back in three months with repeat labs.  - If hemochromatosis is confirmed, consider phlebotomy treatment with frequency based on severity and mutations involved.

## 2023-05-29 NOTE — Progress Notes (Signed)
 [facility]  HEMATOLOGY CLINIC CONSULTATION NOTE   PATIENT NAME: Anthony Lane   MR#: [id number] DOB: [birth year]  DATE OF SERVICE: 05/29/2023   REFERRING PROVIDER  Anthony Hasty, DO   Patient Care Team: Anthony Hasty, DO as PCP - General (Internal Medicine)   REASON FOR CONSULTATION/ CHIEF COMPLAINT:  Elevated ferritin  ASSESSMENT & PLAN:  Anthony Lane is a 63 y.o. gentleman with a past medical history of Guillain-Barre syndrome in 2004, diabetes mellitus, dyslipidemia, ?  Lyme disease in 2023, was referred to our service for evaluation of elevated ferritin.    Elevated ferritin Mildly elevated ferritin levels since March 2024, recently decreased from 577 to 483. Differential diagnosis includes hemochromatosis and inflammation.   Hemochromatosis is less likely due to age and lack of family history, but genetic testing for hemochromatosis mutations (C282Y, H63D, S65C) is planned. Inflammation from conditions like arthritis or diabetes could also contribute to elevated ferritin.   If hemochromatosis is confirmed, treatment involves phlebotomy to manage ferritin levels, with frequency adjusted based on severity and organ involvement.  Labs today showed unremarkable CBCD and CMP.  Iron studies also are within normal limits with iron saturation of 36%.  LDH normal.  Sed rate normal at 2 mm/h.  Ferritin pending.  - Ordered genetic testing for hemochromatosis mutations (C282Y, H63D, S65C)  - Arrange follow-up phone call in two weeks to discuss test results  - Plan to see him back in three months with repeat labs.  - If hemochromatosis is confirmed, consider phlebotomy treatment with frequency based on severity and mutations involved.  GBS (Guillain Barre syndrome) (HCC) Diagnosed in 2004 following a stomach virus, resulting in paralysis from the neck down. Residual symptoms include some hand and leg issues. No current acute issues related to  Guillain-Barr syndrome.   I reviewed lab results and outside records for this visit and discussed relevant results with the patient. Diagnosis, plan of care and treatment options were also discussed in detail with the patient. Opportunity provided to ask questions and answers provided to his apparent satisfaction. Provided instructions to call our clinic with any problems, questions or concerns prior to return visit. I recommended to continue follow-up with PCP and sub-specialists. He verbalized understanding and agreed with the plan. No barriers to learning was detected.  Anthony Berber, MD  05/29/2023 2:24 PM  [facility] - A DEPT OF [name] [address] [place] [address] Dept: [phone] Dept Fax: [phone]   HISTORY OF PRESENT ILLNESS:  Discussed the use of AI scribe software for clinical note transcription with the patient, who gave verbal consent to proceed.  History of Present Illness [name] is a 63 year old male who presents with elevated ferritin levels for evaluation of possible hemochromatosis. He is accompanied by his wife, Anthony Lane. He was referred by Dr. Aggie Horton for evaluation of high ferritin levels.  He has experienced elevated ferritin levels since March 2024, with the most recent measurement being 483, down from 577 in October 2024 and 566 in March 2024. There is no known family history of iron-related disorders. He reports no joint issues, normal bowel habits with occasional constipation, and no changes in stool color. He has no trouble swallowing and is up to date on colonoscopy.  He was diagnosed with Lyme disease last year and was treated with a month-long course of antibiotics. He experienced body aches and symptoms resembling arthritis,  which resolved about a week after starting antibiotics. He recalls a tick bite prior to the onset of symptoms.  In 2004, he was diagnosed with  Guillain-Barr Syndrome following a stomach virus, which resulted in paralysis from the neck down. He underwent two years of rehabilitation and currently experiences residual weakness in his hands and legs.  He is currently on metformin for diabetes and has no known heart-related problems. He experienced significant weight loss following a COVID-19 infection and is actively managing his weight, aiming to maintain around 175 pounds.  He does not smoke or consume alcohol.  He denies fever, cough, diarrhea, or other infectious symptoms.  He denies epistaxis, bloody stool, melena, hematuria, bruising or other bleeding symptoms. He also denies unintentional weight loss, night sweats or other constitutional symptoms.  MEDICAL HISTORY Past Medical History:  Diagnosis Date   Diabetes mellitus (HCC)    Dyslipidemia    Guillain-Barre syndrome (HCC)      SURGICAL HISTORY No past surgical history on file.   SOCIAL HISTORY: He reports that he has never smoked. He has never used smokeless tobacco. He reports that he does not drink alcohol and does not use drugs. Social History   Socioeconomic History   Marital status: Married    Spouse name: Anthony Lane   Number of children: Not on file   Years of education: Not on file   Highest education level: Not on file  Occupational History   Not on file  Tobacco Use   Smoking status: Never   Smokeless tobacco: Never  Substance and Sexual Activity   Alcohol use: No   Drug use: No   Sexual activity: Not on file  Other Topics Concern   Not on file  Social History Narrative   Lives with wife and 3 daughters   Right Handed   Drinks soda occassionally   Social Drivers of Health   Financial Resource Strain: Not on file  Food Insecurity: Not on file  Transportation Needs: Not on file  Physical Activity: Not on file  Stress: Not on file  Social Connections: Not on file  Intimate Partner Violence: Not on file    FAMILY HISTORY: His family history is  not on file.  CURRENT MEDICATIONS   Current Outpatient Medications  Medication Instructions   metFORMIN (GLUCOPHAGE) 500 mg, 2 times daily with meals   Multiple Vitamins-Minerals (MULTIVITAMIN WITH MINERALS) tablet 1 tablet, Daily   rosuvastatin (CRESTOR) 10 mg, Daily     ALLERGIES  He has no known allergies.  REVIEW OF SYSTEMS:  Review of Systems - Oncology   Rest of the pertinent review of systems is unremarkable except as mentioned above in HPI.  PHYSICAL EXAMINATION:    Onc Performance Status - 05/29/23 1023       ECOG Perf Status   ECOG Perf Status Fully active, able to carry on all pre-disease performance without restriction      KPS SCALE   KPS % SCORE Able to carry on normal activity, minor s/s of disease             Vitals:   05/29/23 0940  BP: 109/67  Pulse: 77  Resp: 17  Temp: 98.1 F (36.7 C)  SpO2: 98%   Filed Weights   05/29/23 0940  Weight: 185 lb 11.2 oz (84.2 kg)    Physical Exam Constitutional:      General: He is not in acute distress.    Appearance: Normal appearance.  HENT:     Head: Normocephalic and  atraumatic.  Eyes:     General: No scleral icterus.    Conjunctiva/sclera: Conjunctivae normal.  Cardiovascular:     Rate and Rhythm: Normal rate and regular rhythm.     Heart sounds: Normal heart sounds.  Pulmonary:     Effort: Pulmonary effort is normal.     Breath sounds: Normal breath sounds.  Abdominal:     General: There is no distension.  Musculoskeletal:     Right lower leg: No edema.     Left lower leg: No edema.     Comments: Contractures in both hands from previous GBS  Neurological:     General: No focal deficit present.     Mental Status: He is alert and oriented to person, place, and time.  Psychiatric:        Mood and Affect: Mood normal.        Behavior: Behavior normal.        Thought Content: Thought content normal.     LABORATORY DATA:   I have reviewed the data as listed.  Results for orders  placed or performed in visit on 05/29/23  Lactate dehydrogenase  Result Value Ref Range   LDH 130 98 - 192 U/L  Sedimentation rate  Result Value Ref Range   Sed Rate 2 0 - 16 mm/hr  Iron and Iron Binding Capacity ([facility] only)  Result Value Ref Range   Iron 114 45 - 182 ug/dL   TIBC 161 096 - 045 ug/dL   Saturation Ratios 36 17.9 - 39.5 %   UIBC 205 117 - 376 ug/dL  CMP (Cancer Center only)  Result Value Ref Range   Sodium 138 135 - 145 mmol/L   Potassium 4.0 3.5 - 5.1 mmol/L   Chloride 104 98 - 111 mmol/L   CO2 28 22 - 32 mmol/L   Glucose, Bld 110 (H) 70 - 99 mg/dL   BUN 19 8 - 23 mg/dL   Creatinine 4.09 8.11 - 1.24 mg/dL   Calcium 9.5 8.9 - 91.4 mg/dL   Total Protein 7.6 6.5 - 8.1 g/dL   Albumin 4.5 3.5 - 5.0 g/dL   AST 23 15 - 41 U/L   ALT 41 0 - 44 U/L   Alkaline Phosphatase 41 38 - 126 U/L   Total Bilirubin 0.5 0.0 - 1.2 mg/dL   GFR, Estimated >78 >29 mL/min   Anion gap 6 5 - 15  CBC with Differential (Cancer Center Only)  Result Value Ref Range   WBC Count 6.0 4.0 - 10.5 K/uL   RBC 4.84 4.22 - 5.81 MIL/uL   Hemoglobin 15.8 13.0 - 17.0 g/dL   HCT 56.2 13.0 - 86.5 %   MCV 90.9 80.0 - 100.0 fL   MCH 32.6 26.0 - 34.0 pg   MCHC 35.9 30.0 - 36.0 g/dL   RDW 78.4 69.6 - 29.5 %   Platelet Count 209 150 - 400 K/uL   nRBC 0.0 0.0 - 0.2 %   Neutrophils Relative % 46 %   Neutro Abs 2.8 1.7 - 7.7 K/uL   Lymphocytes Relative 40 %   Lymphs Abs 2.4 0.7 - 4.0 K/uL   Monocytes Relative 8 %   Monocytes Absolute 0.5 0.1 - 1.0 K/uL   Eosinophils Relative 5 %   Eosinophils Absolute 0.3 0.0 - 0.5 K/uL   Basophils Relative 1 %   Basophils Absolute 0.0 0.0 - 0.1 K/uL   Immature Granulocytes 0 %   Abs Immature Granulocytes 0.02 0.00 - 0.07 K/uL  RADIOGRAPHIC STUDIES:  No pertinent imaging studies available to review.  Orders Placed This Encounter  Procedures   CBC with Differential ([facility] Center Only)    Standing Status:   Future    Number of Occurrences:   1     Expiration Date:   05/28/2024   CMP (Cancer Center only)    Standing Status:   Future    Number of Occurrences:   1    Expiration Date:   05/28/2024   Iron and Iron Binding Capacity ([facility] only)    Standing Status:   Future    Number of Occurrences:   1    Expiration Date:   05/28/2024   Ferritin    Standing Status:   Future    Number of Occurrences:   1    Expiration Date:   05/28/2024   C-reactive protein    Standing Status:   Future    Number of Occurrences:   1    Expiration Date:   05/28/2024   Sedimentation rate    Standing Status:   Future    Number of Occurrences:   1    Expiration Date:   05/28/2024   Lactate dehydrogenase    Standing Status:   Future    Number of Occurrences:   1    Expiration Date:   05/28/2024   Hemochromatosis DNA, PCR    Standing Status:   Future    Number of Occurrences:   1    Expiration Date:   05/28/2024   TSH    Standing Status:   Future    Number of Occurrences:   1    Expiration Date:   05/28/2024    Future Appointments  Date Time Provider Department Center  06/10/2023 10:30 AM Chaniqua Brisby, Gale Jude, MD [facility] None  08/28/2023 10:00 AM [facility] LAB [facility] None  08/28/2023 10:30 AM Taegan Standage, MD [facility] None    I spent a total of 55 minutes during this encounter with the patient including review of chart and various tests results, discussions about plan of care and coordination of care plan.  This document was completed utilizing speech recognition software. Grammatical errors, random word insertions, pronoun errors, and incomplete sentences are an occasional consequence of this system due to software limitations, ambient noise, and hardware issues. Any formal questions or concerns about the content, text or information contained within the body of this dictation should be directly addressed to the provider for clarification.

## 2023-05-29 NOTE — Assessment & Plan Note (Signed)
 Diagnosed in 2004 following a stomach virus, resulting in paralysis from the neck down. Residual symptoms include some hand and leg issues. No current acute issues related to Guillain-Barr syndrome.

## 2023-06-04 LAB — HEMOCHROMATOSIS DNA-PCR(C282Y,H63D)

## 2023-06-10 ENCOUNTER — Encounter: Payer: Self-pay | Admitting: Oncology

## 2023-06-10 ENCOUNTER — Inpatient Hospital Stay (HOSPITAL_BASED_OUTPATIENT_CLINIC_OR_DEPARTMENT_OTHER): Admitting: Oncology

## 2023-06-10 DIAGNOSIS — R7989 Other specified abnormal findings of blood chemistry: Secondary | ICD-10-CM | POA: Diagnosis not present

## 2023-06-10 NOTE — Progress Notes (Signed)
 [facility]  HEMATOLOGY-ONCOLOGY ELECTRONIC VISIT PROGRESS NOTE  PATIENT NAME: Anthony Lane   MR#: [id number] DOB: 10-11-1960  DATE OF SERVICE: 06/10/2023  Patient Care Team: Windell Hasty, DO as PCP - General (Internal Medicine)  I connected with the patient via telephone conference and verified that I am speaking with the correct person using two identifiers. The patient's location is at home and I am providing care from the [facility].  I discussed the limitations, risks, security and privacy concerns of performing an evaluation and management service by e-visits and the availability of in person appointments. I also discussed with the patient that there may be a patient responsible charge related to this service. The patient expressed understanding and agreed to proceed.   ASSESSMENT & PLAN:   [name] is a 63 y.o. gentleman with a past medical history of Guillain-Barre syndrome in 2004, diabetes mellitus, dyslipidemia, ? Lyme disease in 2023, was referred to our service for evaluation of elevated ferritin.   Elevated ferritin Mildly elevated ferritin levels since March 2024, recently decreased from 577 to 483.   On his consultation with us  on 05/29/2023, labs showed ferritin of 493.  Iron saturation was 36%, rest of the iron studies were within normal limits.  CBCD and CMP were grossly unremarkable.  LDH and sed rate were within normal limits.  CRP was mildly elevated at 1.2 mg/dL.  HFE gene mutation analysis was negative for C282Y, H63D, S65C mutations, ruling out hemochromatosis.   Inflammation from conditions like arthritis or diabetes could be contributing to his elevated ferritin.  No indication for therapeutic phlebotomies.  - Plan to see him back in three months with repeat labs. Consider discharge or less frequent follow-up if ferritin levels remain stable.    I discussed the assessment and treatment plan with the patient. The patient was  provided an opportunity to ask questions and all were answered. The patient agreed with the plan and demonstrated an understanding of the instructions. The patient was advised to call back or seek an in-person evaluation if the symptoms worsen or if the condition fails to improve as anticipated.    I spent 12 minutes over the phone with the patient reviewing test results, discuss management and coordination/planning of care.  [name], MD 06/10/2023 10:36 AM [facility] - A DEPT OF [facility]. [facility] [address] [place] [address] Dept: [phone] Dept Fax: [phone]   INTERVAL HISTORY:  Please see above for problem oriented charting.  The purpose of today's discussion is to explain recent lab results and to formulate plan of care.  Discussed the use of AI scribe software for clinical note transcription with the patient, who gave verbal consent to proceed.  History of Present Illness [name] is a 63 year old male who presents for follow-up of elevated ferritin levels.  He has been monitored for elevated ferritin levels, with the most recent measurement at 493 ng/mL. Despite this elevation, his blood counts, kidney and liver function tests, electrolytes, and other iron studies, including iron saturation and total iron level, remain within normal limits.  A genetic test for hemochromatosis was conducted and returned negative. Inflammatory markers were checked, revealing a slightly elevated C-reactive protein (CRP) level of 1.2 mg/L, which is marginally above the normal range of less than 1 mg/L.   SUMMARY OF HEMATOLOGY HISTORY:   He has experienced elevated ferritin levels since March 2024,  with the most recent measurement being 483, down from 577 in October 2024 and 566 in March 2024. There is no known family history of iron-related disorders. He reports no joint issues, normal bowel habits with  occasional constipation, and no changes in stool color. He has no trouble swallowing and is up to date on colonoscopy.   He was diagnosed with Lyme disease last year and was treated with a month-long course of antibiotics. He experienced body aches and symptoms resembling arthritis, which resolved about a week after starting antibiotics. He recalls a tick bite prior to the onset of symptoms.   In 2004, he was diagnosed with Guillain-Barr Syndrome following a stomach virus, which resulted in paralysis from the neck down. He underwent two years of rehabilitation and currently experiences residual weakness in his hands and legs.   He is currently on metformin for diabetes and has no known heart-related problems. He experienced significant weight loss following a COVID-19 infection and is actively managing his weight, aiming to maintain around 175 pounds.   He does not smoke or consume alcohol.  On his consultation with us  on 05/29/2023, labs showed ferritin of 493.  Iron saturation was 36%, rest of the iron studies were within normal limits.  CBCD and CMP were grossly unremarkable.  LDH and sed rate were within normal limits.  CRP was mildly elevated at 1.2 mg/dL.  HFE gene mutation analysis was negative for C282Y, H63D, S65C mutations, ruling out hemochromatosis.   Inflammation from conditions like arthritis or diabetes could be contributing to his elevated ferritin.  No indication for therapeutic phlebotomies.  REVIEW OF SYSTEMS:    Review of Systems - Oncology  All other pertinent systems were reviewed with the patient and are negative.  I have reviewed the past medical history, past surgical history, social history and family history with the patient and they are unchanged from previous note.  ALLERGIES:  He has no known allergies.  MEDICATIONS:  Current Outpatient Medications  Medication Sig Dispense Refill   metFORMIN (GLUCOPHAGE) 500 MG tablet Take 500 mg by mouth 2 (two) times daily  with a meal.     Multiple Vitamins-Minerals (MULTIVITAMIN WITH MINERALS) tablet Take 1 tablet by mouth daily. (Patient not taking: Reported on 05/29/2023)     rosuvastatin (CRESTOR) 10 MG tablet Take 10 mg by mouth daily.     No current facility-administered medications for this visit.    PHYSICAL EXAMINATION:   Onc Performance Status - 06/10/23 0900       ECOG Perf Status   ECOG Perf Status Fully active, able to carry on all pre-disease performance without restriction      KPS SCALE   KPS % SCORE Able to carry on normal activity, minor s/s of disease             LABORATORY DATA:   I have reviewed the data as listed.  Recent Results (from the past 2160 hours)  TSH     Status: None   Collection Time: 05/29/23 11:01 AM  Result Value Ref Range   TSH 2.750 0.350 - 4.500 uIU/mL    Comment: Performed at [facility], [address] [place], [place] [address]  Hemochromatosis DNA, PCR     Status: None   Collection Time: 05/29/23 11:01 AM  Result Value Ref Range   DNA Mutation Analysis Comment     Comment: (NOTE) Result: c.845G>A (p.Cys282Tyr) - Not Detected c.187C>G (p.His63Asp) - Not Detected c.193A>T (p.Ser65Cys) - Not Detected Not associated with  increased risk to develop clinical symptoms of Hereditary Hemochromatosis. In symptomatic individuals, other causes of iron overload should be evaluated. See Additional Information and Comments. Additional Clinical Information: Hereditary hemochromatosis (HFE related) is an autosomal recessive iron storage disorder. Patients may have a genetic diagnosis of hereditary hemochromatosis and never show clinical symptoms. Clinical symptoms typically appear between 40 to 60 years in males and after menopause in females. Signs and symptoms may include organ damage, primarily in the liver, risk for hepatocellular carcinoma, diabetes, and heart disease due to iron accumulation. Life expectancy may be decreased in  individuals who develop cirrhosis. Treatment for clinically symptomatic individuals may include therapeutic phlebotomy. L iver transplant may be used to treat end stage liver failure. For preventive care, monitoring for iron overload is recommended for patients who are homozygous for c.845G>A (p.Cys282Tyr) and have yet to experience clinical symptoms. Comments: The most common HFE variants associated with hereditary hemochromatosis are c.845G>A (p.Cys282Tyr), c.187C>G (p.His63Asp), c.193A>T (p.Ser65Cys). While patients homozygous for c.845G>A (p.Cys282Tyr) are the most likely to present clinical symptoms, less than 10% develop clinically significant iron overload with tissue and organ damage. Genetic counseling is recommended to discuss the potential clinical implications of positive results, as well as recommendations for testing family members. Genetic Coordinators are available for health care providers to discuss results at [phone]). Test Details: Three variants analyzed: c.845G>A (p.Cys282Tyr), commonly referred to as C282Y c.187C>G (p.His63Asp), commonly referred to a s H63D c.193A>T (p.Ser65Cys), commonly referred to as S65C Methods/Limitations: DNA Analysis of the HFE gene (NM_[id number]) was performed by PCR amplification followed by restriction enzyme digestion analyses. Results must be combined with clinical information for the most accurate interpretation. Molecular-based testing is highly accurate, but as in any laboratory test, diagnostic errors may occur. False positive or false negative results may occur for reasons that include genetic variants, blood transfusions, bone marrow transplantation, somatic or tissue-specific mosaicism, mislabeled samples, or erroneous representation of family relationships. This test was developed and its performance characteristics determined by [facility]. It has not been cleared or approved by the Food and Drug  Administration. References: [facility], [address] [place], [place]; American Association for the Study of Liver Diseases. Diagnosis and management of hemochromatosis: 2011  practice guideline by the American Association for the Study of Liver Diseases. Hepatology. 2011 Jul;54(1):328-43. doi: 10.1002/hep.24330. PMID: 13244010; PMCID: UVO5366440. [address] [place]issot P, Swinkels DW, Zoller H, Kamarainen O, Patton S, Alonso I, Morris M, Keeney S. EMQN best practice guidelines for the molecular genetic diagnosis of hereditary hemochromatosis [facility]). Eur J Hum Genet. 2016 Apr;24(4):479-95. doi: 10.1038/ejhg.2015.128. Epub 2015 Jul 8. PMID: 34742595; PMCID: GLO7564332.    Reviewed by: Comment     Comment: (NOTE) Technical Component performed at [facility] Professional Component performed by: Horald Lyme, PhD, [facility] [id number], [facility], [address] [place] [address] Performed At: [facility] [address] [place], [place] [id number] Adams Adams MDPhD [id number]   Lactate dehydrogenase     Status: None   Collection Time: 05/29/23 11:01 AM  Result Value Ref Range   LDH 130 98 - 192 U/L    Comment: Performed at [facility], [address], [place], [place] [address]  Sedimentation rate     Status: None   Collection Time: 05/29/23 11:01 AM  Result Value Ref Range   Sed Rate 2 0 - 16 mm/hr    Comment: Performed at [facility], [address] [place], [place] [address]  C-reactive protein  Status: Abnormal   Collection Time: 05/29/23 11:01 AM  Result Value Ref Range   CRP 1.2 (H) <1.0 mg/dL    Comment: Performed at [facility], [address] [place], [place] [address]  Ferritin     Status: Abnormal   Collection Time: 05/29/23 11:01 AM  Result Value Ref Range   Ferritin 493 (H) 24 - 336 ng/mL    Comment: Performed at [facility], [address], [place], [place] [address]  Iron and Iron  Binding Capacity ([facility] only)     Status: None   Collection Time: 05/29/23 11:01 AM  Result Value Ref Range   Iron 114 45 - 182 ug/dL   TIBC 829 562 - 130 ug/dL   Saturation Ratios 36 17.9 - 39.5 %   UIBC 205 117 - 376 ug/dL    Comment: Performed at [facility], [address] [place], [place] [address]  CMP (Cancer Center only)     Status: Abnormal   Collection Time: 05/29/23 11:01 AM  Result Value Ref Range   Sodium 138 135 - 145 mmol/L   Potassium 4.0 3.5 - 5.1 mmol/L   Chloride 104 98 - 111 mmol/L   CO2 28 22 - 32 mmol/L   Glucose, Bld 110 (H) 70 - 99 mg/dL    Comment: Glucose reference range applies only to samples taken after fasting for at least 8 hours.   BUN 19 8 - 23 mg/dL   Creatinine 4.69 6.29 - 1.24 mg/dL   Calcium 9.5 8.9 - 52.8 mg/dL   Total Protein 7.6 6.5 - 8.1 g/dL   Albumin 4.5 3.5 - 5.0 g/dL   AST 23 15 - 41 U/L   ALT 41 0 - 44 U/L   Alkaline Phosphatase 41 38 - 126 U/L   Total Bilirubin 0.5 0.0 - 1.2 mg/dL   GFR, Estimated >41 >32 mL/min    Comment: (NOTE) Calculated using the CKD-EPI Creatinine Equation (2021)    Anion gap 6 5 - 15    Comment: Performed at [facility], [address], [place], [place] [address]  CBC with Differential (Cancer Center Only)     Status: None   Collection Time: 05/29/23 11:01 AM  Result Value Ref Range   WBC Count 6.0 4.0 - 10.5 K/uL   RBC 4.84 4.22 - 5.81 MIL/uL   Hemoglobin 15.8 13.0 - 17.0 g/dL   HCT 27.2 53.6 - 64.4 %   MCV 90.9 80.0 - 100.0 fL   MCH 32.6 26.0 - 34.0 pg   MCHC 35.9 30.0 - 36.0 g/dL   RDW 03.4 74.2 - 59.5 %   Platelet Count 209 150 - 400 K/uL   nRBC 0.0 0.0 - 0.2 %   Neutrophils Relative % 46 %   Neutro Abs 2.8 1.7 - 7.7 K/uL   Lymphocytes Relative 40 %   Lymphs Abs 2.4 0.7 - 4.0 K/uL   Monocytes Relative 8 %   Monocytes Absolute 0.5 0.1 - 1.0 K/uL   Eosinophils Relative 5 %   Eosinophils Absolute 0.3 0.0 - 0.5 K/uL   Basophils Relative 1  %   Basophils Absolute 0.0 0.0 - 0.1 K/uL   Immature Granulocytes 0 %   Abs Immature Granulocytes 0.02 0.00 - 0.07 K/uL    Comment: Performed at [facility], [address], [place], [place] [address]     RADIOGRAPHIC STUDIES:  No recent pertinent imaging studies available to review.  Orders Placed This Encounter  Procedures   CBC with Differential (Cancer Center Only)    Standing Status:   Future    Expected Date:   08/28/2023    Expiration Date:   06/09/2024   CMP (Cancer Center only)    Standing Status:   Future    Expected Date:   08/28/2023    Expiration Date:   06/09/2024   Iron and Iron Binding Capacity ([facility] only)    Standing Status:   Future    Expected Date:   08/28/2023    Expiration Date:   06/09/2024   Ferritin    Standing Status:   Future    Expected Date:   08/28/2023    Expiration Date:   06/09/2024   C-reactive protein    Standing Status:   Future    Expected Date:   08/28/2023    Expiration Date:   06/09/2024     Future Appointments  Date Time Provider Department Center  08/28/2023 10:00 AM [facility] None  08/28/2023 10:30 AM Cyncere Sontag, Gale Jude, MD [facility] None    This document was completed utilizing speech recognition software. Grammatical errors, random word insertions, pronoun errors, and incomplete sentences are an occasional consequence of this system due to software limitations, ambient noise, and hardware issues. Any formal questions or concerns about the content, text or information contained within the body of this dictation should be directly addressed to the provider for clarification.

## 2023-06-10 NOTE — Assessment & Plan Note (Addendum)
 Mildly elevated ferritin levels since March 2024, recently decreased from 577 to 483.   On his consultation with us  on 05/29/2023, labs showed ferritin of 493.  Iron saturation was 36%, rest of the iron studies were within normal limits.  CBCD and CMP were grossly unremarkable.  LDH and sed rate were within normal limits.  CRP was mildly elevated at 1.2 mg/dL.  HFE gene mutation analysis was negative for C282Y, H63D, S65C mutations, ruling out hemochromatosis.   Inflammation from conditions like arthritis or diabetes could be contributing to his elevated ferritin.  No indication for therapeutic phlebotomies.  - Plan to see him back in three months with repeat labs. Consider discharge or less frequent follow-up if ferritin levels remain stable.

## 2023-08-27 ENCOUNTER — Telehealth: Payer: Self-pay | Admitting: Oncology

## 2023-08-27 NOTE — Telephone Encounter (Signed)
 Anthony Lane

## 2023-08-27 NOTE — Telephone Encounter (Signed)
 I returned Werner's call and he confirmed that he has spoken with someone and he will be here tomorrow around 9:30am.

## 2023-08-28 ENCOUNTER — Inpatient Hospital Stay (HOSPITAL_BASED_OUTPATIENT_CLINIC_OR_DEPARTMENT_OTHER): Admitting: Oncology

## 2023-08-28 ENCOUNTER — Inpatient Hospital Stay: Attending: Oncology

## 2023-08-28 ENCOUNTER — Encounter: Payer: Self-pay | Admitting: Oncology

## 2023-08-28 VITALS — BP 120/84 | HR 84 | Temp 98.3°F | Resp 15 | Wt 186.3 lb

## 2023-08-28 DIAGNOSIS — R7401 Elevation of levels of liver transaminase levels: Secondary | ICD-10-CM | POA: Diagnosis not present

## 2023-08-28 DIAGNOSIS — R7989 Other specified abnormal findings of blood chemistry: Secondary | ICD-10-CM | POA: Diagnosis not present

## 2023-08-28 DIAGNOSIS — Z79899 Other long term (current) drug therapy: Secondary | ICD-10-CM | POA: Insufficient documentation

## 2023-08-28 DIAGNOSIS — R531 Weakness: Secondary | ICD-10-CM | POA: Insufficient documentation

## 2023-08-28 LAB — IRON AND IRON BINDING CAPACITY (CC-WL,HP ONLY)
Iron: 144 ug/dL (ref 45–182)
Saturation Ratios: 47 % — ABNORMAL HIGH (ref 17.9–39.5)
TIBC: 307 ug/dL (ref 250–450)
UIBC: 163 ug/dL (ref 117–376)

## 2023-08-28 LAB — CBC WITH DIFFERENTIAL (CANCER CENTER ONLY)
Abs Immature Granulocytes: 0.01 K/uL (ref 0.00–0.07)
Basophils Absolute: 0.1 K/uL (ref 0.0–0.1)
Basophils Relative: 1 %
Eosinophils Absolute: 0.3 K/uL (ref 0.0–0.5)
Eosinophils Relative: 5 %
HCT: 43.6 % (ref 39.0–52.0)
Hemoglobin: 15.8 g/dL (ref 13.0–17.0)
Immature Granulocytes: 0 %
Lymphocytes Relative: 40 %
Lymphs Abs: 2.4 K/uL (ref 0.7–4.0)
MCH: 32.9 pg (ref 26.0–34.0)
MCHC: 36.2 g/dL — ABNORMAL HIGH (ref 30.0–36.0)
MCV: 90.8 fL (ref 80.0–100.0)
Monocytes Absolute: 0.5 K/uL (ref 0.1–1.0)
Monocytes Relative: 8 %
Neutro Abs: 2.8 K/uL (ref 1.7–7.7)
Neutrophils Relative %: 46 %
Platelet Count: 210 K/uL (ref 150–400)
RBC: 4.8 MIL/uL (ref 4.22–5.81)
RDW: 11.9 % (ref 11.5–15.5)
WBC Count: 6.1 K/uL (ref 4.0–10.5)
nRBC: 0 % (ref 0.0–0.2)

## 2023-08-28 LAB — FERRITIN: Ferritin: 546 ng/mL — ABNORMAL HIGH (ref 24–336)

## 2023-08-28 LAB — CMP (CANCER CENTER ONLY)
ALT: 52 U/L — ABNORMAL HIGH (ref 0–44)
AST: 27 U/L (ref 15–41)
Albumin: 4.5 g/dL (ref 3.5–5.0)
Alkaline Phosphatase: 46 U/L (ref 38–126)
Anion gap: 6 (ref 5–15)
BUN: 13 mg/dL (ref 8–23)
CO2: 28 mmol/L (ref 22–32)
Calcium: 9.5 mg/dL (ref 8.9–10.3)
Chloride: 104 mmol/L (ref 98–111)
Creatinine: 1.03 mg/dL (ref 0.61–1.24)
GFR, Estimated: >60 mL/min (ref >60)
Glucose, Bld: 133 mg/dL — ABNORMAL HIGH (ref 70–99)
Potassium: 4.2 mmol/L (ref 3.5–5.1)
Sodium: 138 mmol/L (ref 135–145)
Total Bilirubin: 0.6 mg/dL (ref 0.0–1.2)
Total Protein: 7.2 g/dL (ref 6.5–8.1)

## 2023-08-28 LAB — C-REACTIVE PROTEIN: CRP: 1.5 mg/dL — ABNORMAL HIGH (ref <1.0)

## 2023-08-28 NOTE — Progress Notes (Signed)
 [facility]  HEMATOLOGY CLINIC PROGRESS NOTE  PATIENT NAME: Anthony Lane   MR#: [id number] DOB: November 03, 1960  Patient Care Team: Valentin Skates, DO as PCP - General (Internal Medicine)  Date of visit: 08/28/2023   ASSESSMENT & PLAN:   Anthony Lane is a 63 y.o.  gentleman with a past medical history of Guillain-Barre syndrome in 2004, diabetes mellitus, dyslipidemia, ? Lyme disease in 2023, was referred to our service in May 2025 for evaluation of elevated ferritin.  HFE gene mutation analysis was negative for C282Y, H63D, S65C mutations, ruling out HFE-related hemochromatosis.   Elevated ferritin Mildly elevated ferritin levels since March 2024, recently decreased from 577 to 483.   On his consultation with us  on 05/29/2023, labs showed ferritin of 493.  Iron saturation was 36%, rest of the iron studies were within normal limits.  CBCD and CMP were grossly unremarkable.  LDH and sed rate were within normal limits.  CRP was mildly elevated at 1.2 mg/dL.  HFE gene mutation analysis was negative for C282Y, H63D, S65C mutations, ruling out HFE related hemochromatosis.   Labs today showed unremarkable CBCD and CMP with normal LFTs except mildly elevated ALT of 52.  Iron studies grossly within normal limits.  Iron saturation is fluctuating.  Ferritin pending.  Inflammation from conditions like arthritis or diabetes could be contributing to his elevated ferritin.  No indication for therapeutic phlebotomies.  - Plan to see him back in 6 months with repeat labs. Consider discharge or less frequent follow-up if ferritin levels remain stable.    I spent a total of 20 minutes during this encounter with the patient including review of chart and various tests results, discussions about plan of care and coordination of care plan.  I reviewed lab results and outside records for this visit and discussed relevant results with the patient. Diagnosis, plan of care and treatment  options were also discussed in detail with the patient. Opportunity provided to ask questions and answers provided to his apparent satisfaction. Provided instructions to call our clinic with any problems, questions or concerns prior to return visit. I recommended to continue follow-up with PCP and sub-specialists. He verbalized understanding and agreed with the plan. No barriers to learning was detected.  Chinita Patten, MD  08/28/2023 1:30 PM  [facility] - A DEPT OF [facility] [address] [place] [address] Dept: [id number] Dept Fax: [phone]   CHIEF COMPLAINT/ REASON FOR VISIT:  Follow-up for elevated ferritin. HFE gene mutation analysis was negative for C282Y, H63D, S65C mutations, ruling out HFE-related hemochromatosis.  Likely reactionary to inflammation.  INTERVAL HISTORY:  Discussed the use of AI scribe software for clinical note transcription with the patient, who gave verbal consent to proceed.  History of Present Illness Anthony Lane is a 63 year old male who presents for follow-up of elevated ferritin levels.  He has been monitored for elevated ferritin levels, with previous tests for hemochromatosis returning negative. His ferritin level has decreased from 577 to 483, with normal levels around 350. Current ferritin levels are pending from today's blood work.  No new symptoms or concerns over the past three months. No weakness in his arms or legs. He maintains a stable weight of approximately 180 pounds and reports no issues with bowel movements, including no blood or black stools.    SUMMARY OF HEMATOLOGIC HISTORY:  He has experienced elevated ferritin levels since March 2024,  with the most recent measurement being 483, down from 577 in October 2024 and 566 in March 2024. There is no known family history of iron-related disorders. He reports no joint issues, normal bowel habits with occasional  constipation, and no changes in stool color. He has no trouble swallowing and is up to date on colonoscopy.   He was diagnosed with Lyme disease last year and was treated with a month-long course of antibiotics. He experienced body aches and symptoms resembling arthritis, which resolved about a week after starting antibiotics. He recalls a tick bite prior to the onset of symptoms.   In 2004, he was diagnosed with Guillain-Barr Syndrome following a stomach virus, which resulted in paralysis from the neck down. He underwent two years of rehabilitation and currently experiences residual weakness in his hands and legs.   He is currently on metformin for diabetes and has no known heart-related problems. He experienced significant weight loss following a COVID-19 infection and is actively managing his weight, aiming to maintain around 175 pounds.   He does not smoke or consume alcohol.   On his consultation with us  on 05/29/2023, labs showed ferritin of 493.  Iron saturation was 36%, rest of the iron studies were within normal limits.  CBCD and CMP were grossly unremarkable.  LDH and sed rate were within normal limits.  CRP was mildly elevated at 1.2 mg/dL.  HFE gene mutation analysis was negative for C282Y, H63D, S65C mutations, ruling out HFE related hemochromatosis.    Inflammation from conditions like arthritis or diabetes could be contributing to his elevated ferritin.  No indication for therapeutic phlebotomies.  I have reviewed the past medical history, past surgical history, social history and family history with the patient and they are unchanged from previous note.  ALLERGIES: He has no known allergies.  MEDICATIONS:  Current Outpatient Medications  Medication Sig Dispense Refill   metFORMIN (GLUCOPHAGE) 500 MG tablet Take 500 mg by mouth 2 (two) times daily with a meal.     rosuvastatin (CRESTOR) 10 MG tablet Take 10 mg by mouth daily.     Multiple Vitamins-Minerals (MULTIVITAMIN WITH  MINERALS) tablet Take 1 tablet by mouth daily. (Patient not taking: Reported on 08/28/2023)     No current facility-administered medications for this visit.     REVIEW OF SYSTEMS:    Review of Systems - Oncology  All other pertinent systems were reviewed with the patient and are negative.  PHYSICAL EXAMINATION:    Onc Performance Status - 08/28/23 1016       ECOG Perf Status   ECOG Perf Status Restricted in physically strenuous activity but ambulatory and able to carry out work of a light or sedentary nature, e.g., light house work, office work      KPS SCALE   KPS % SCORE Able to carry on normal activity, minor s/s of disease          Vitals:   08/28/23 1015  BP: 120/84  Pulse: 84  Resp: 15  Temp: 98.3 F (36.8 C)  SpO2: 99%   Filed Weights   08/28/23 1015  Weight: 186 lb 4.8 oz (84.5 kg)    Physical Exam Constitutional:      General: He is not in acute distress.    Appearance: Normal appearance.  HENT:     Head: Normocephalic and atraumatic.  Eyes:     Conjunctiva/sclera: Conjunctivae normal.  Cardiovascular:     Rate and Rhythm: Normal rate and regular rhythm.  Pulmonary:  Effort: Pulmonary effort is normal. No respiratory distress.  Abdominal:     General: There is no distension.  Neurological:     General: No focal deficit present.     Mental Status: He is alert and oriented to person, place, and time.  Psychiatric:        Mood and Affect: Mood normal.        Behavior: Behavior normal.      LABORATORY DATA:   I have reviewed the data as listed.  Results for orders placed or performed in visit on 08/28/23  CBC with Differential (Cancer Center Only)  Result Value Ref Range   WBC Count 6.1 4.0 - 10.5 K/uL   RBC 4.80 4.22 - 5.81 MIL/uL   Hemoglobin 15.8 13.0 - 17.0 g/dL   HCT 56.3 60.9 - 47.9 %   MCV 90.8 80.0 - 100.0 fL   MCH 32.9 26.0 - 34.0 pg   MCHC 36.2 (H) 30.0 - 36.0 g/dL   RDW 88.0 88.4 - 84.4 %   Platelet Count 210 150 - 400  K/uL   nRBC 0.0 0.0 - 0.2 %   Neutrophils Relative % 46 %   Neutro Abs 2.8 1.7 - 7.7 K/uL   Lymphocytes Relative 40 %   Lymphs Abs 2.4 0.7 - 4.0 K/uL   Monocytes Relative 8 %   Monocytes Absolute 0.5 0.1 - 1.0 K/uL   Eosinophils Relative 5 %   Eosinophils Absolute 0.3 0.0 - 0.5 K/uL   Basophils Relative 1 %   Basophils Absolute 0.1 0.0 - 0.1 K/uL   Immature Granulocytes 0 %   Abs Immature Granulocytes 0.01 0.00 - 0.07 K/uL  CMP (Cancer Center only)  Result Value Ref Range   Sodium 138 135 - 145 mmol/L   Potassium 4.2 3.5 - 5.1 mmol/L   Chloride 104 98 - 111 mmol/L   CO2 28 22 - 32 mmol/L   Glucose, Bld 133 (H) 70 - 99 mg/dL   BUN 13 8 - 23 mg/dL   Creatinine 8.96 9.38 - 1.24 mg/dL   Calcium 9.5 8.9 - 89.6 mg/dL   Total Protein 7.2 6.5 - 8.1 g/dL   Albumin 4.5 3.5 - 5.0 g/dL   AST 27 15 - 41 U/L   ALT 52 (H) 0 - 44 U/L   Alkaline Phosphatase 46 38 - 126 U/L   Total Bilirubin 0.6 0.0 - 1.2 mg/dL   GFR, Estimated >39 >39 mL/min   Anion gap 6 5 - 15  Iron and Iron Binding Capacity ([facility] only)  Result Value Ref Range   Iron 144 45 - 182 ug/dL   TIBC 692 749 - 549 ug/dL   Saturation Ratios 47 (H) 17.9 - 39.5 %   UIBC 163 117 - 376 ug/dL    RADIOGRAPHIC STUDIES:  No recent pertinent imaging studies available to review.  No orders of the defined types were placed in this encounter.    Future Appointments  Date Time Provider Department Center  02/26/2024  8:00 AM [facility] None  02/26/2024  8:30 AM Ahmari Garton, Chinita, MD [facility] None     This document was completed utilizing speech recognition software. Grammatical errors, random word insertions, pronoun errors, and incomplete sentences are an occasional consequence of this system due to software limitations, ambient noise, and hardware issues. Any formal questions or concerns about the content, text or information contained within the body of this dictation should be directly addressed to the  provider for clarification.

## 2023-08-28 NOTE — Assessment & Plan Note (Addendum)
 Mildly elevated ferritin levels since March 2024, recently decreased from 577 to 483.   On his consultation with us  on 05/29/2023, labs showed ferritin of 493.  Iron saturation was 36%, rest of the iron studies were within normal limits.  CBCD and CMP were grossly unremarkable.  LDH and sed rate were within normal limits.  CRP was mildly elevated at 1.2 mg/dL.  HFE gene mutation analysis was negative for C282Y, H63D, S65C mutations, ruling out HFE related hemochromatosis.   Labs today showed unremarkable CBCD and CMP with normal LFTs except mildly elevated ALT of 52.  Iron studies grossly within normal limits.  Iron saturation is fluctuating.  Ferritin pending.  Inflammation from conditions like arthritis or diabetes could be contributing to his elevated ferritin.  No indication for therapeutic phlebotomies.  - Plan to see him back in 6 months with repeat labs. Consider discharge or less frequent follow-up if ferritin levels remain stable.

## 2023-09-15 DIAGNOSIS — W57XXXA Bitten or stung by nonvenomous insect and other nonvenomous arthropods, initial encounter: Secondary | ICD-10-CM | POA: Diagnosis not present

## 2023-09-15 DIAGNOSIS — L989 Disorder of the skin and subcutaneous tissue, unspecified: Secondary | ICD-10-CM | POA: Diagnosis not present

## 2023-10-01 DIAGNOSIS — E663 Overweight: Secondary | ICD-10-CM | POA: Diagnosis not present

## 2023-10-01 DIAGNOSIS — E785 Hyperlipidemia, unspecified: Secondary | ICD-10-CM | POA: Diagnosis not present

## 2023-10-01 DIAGNOSIS — M24549 Contracture, unspecified hand: Secondary | ICD-10-CM | POA: Diagnosis not present

## 2023-10-01 DIAGNOSIS — R7989 Other specified abnormal findings of blood chemistry: Secondary | ICD-10-CM | POA: Diagnosis not present

## 2023-10-01 DIAGNOSIS — E1169 Type 2 diabetes mellitus with other specified complication: Secondary | ICD-10-CM | POA: Diagnosis not present

## 2023-10-01 DIAGNOSIS — G629 Polyneuropathy, unspecified: Secondary | ICD-10-CM | POA: Diagnosis not present

## 2023-10-01 DIAGNOSIS — Z8669 Personal history of other diseases of the nervous system and sense organs: Secondary | ICD-10-CM | POA: Diagnosis not present

## 2024-02-26 ENCOUNTER — Inpatient Hospital Stay

## 2024-02-26 ENCOUNTER — Inpatient Hospital Stay: Admitting: Oncology
